# Patient Record
Sex: Male | Born: 1950 | Race: White | Hispanic: No | Marital: Married | Smoking: Former smoker
Health system: Southern US, Community
[De-identification: ages and names within clinical notes are randomized; demographics above are authoritative.]

## PROBLEM LIST (undated history)

## (undated) DIAGNOSIS — N529 Male erectile dysfunction, unspecified: Secondary | ICD-10-CM

## (undated) DIAGNOSIS — M109 Gout, unspecified: Secondary | ICD-10-CM

## (undated) DIAGNOSIS — N042 Nephrotic syndrome with diffuse membranous glomerulonephritis, unspecified: Secondary | ICD-10-CM

## (undated) DIAGNOSIS — N3281 Overactive bladder: Secondary | ICD-10-CM

## (undated) DIAGNOSIS — E785 Hyperlipidemia, unspecified: Secondary | ICD-10-CM

## (undated) DIAGNOSIS — I10 Essential (primary) hypertension: Secondary | ICD-10-CM

## (undated) DIAGNOSIS — R569 Unspecified convulsions: Secondary | ICD-10-CM

## (undated) DIAGNOSIS — K219 Gastro-esophageal reflux disease without esophagitis: Secondary | ICD-10-CM

## (undated) DIAGNOSIS — R001 Bradycardia, unspecified: Secondary | ICD-10-CM

## (undated) DIAGNOSIS — N289 Disorder of kidney and ureter, unspecified: Secondary | ICD-10-CM

## (undated) DIAGNOSIS — D849 Immunodeficiency, unspecified: Secondary | ICD-10-CM

## (undated) DIAGNOSIS — I639 Cerebral infarction, unspecified: Secondary | ICD-10-CM

## (undated) DIAGNOSIS — I2089 Other forms of angina pectoris: Secondary | ICD-10-CM

## (undated) DIAGNOSIS — N184 Chronic kidney disease, stage 4 (severe): Secondary | ICD-10-CM

## (undated) DIAGNOSIS — I208 Other forms of angina pectoris: Secondary | ICD-10-CM

## (undated) DIAGNOSIS — R079 Chest pain, unspecified: Secondary | ICD-10-CM

## (undated) HISTORY — DX: Hyperlipidemia, unspecified: E78.5

## (undated) HISTORY — PX: RECONSTRUCTION OF NOSE: SHX2301

## (undated) HISTORY — PX: HEMORRHOID SURGERY: SHX153

## (undated) HISTORY — PX: BACK SURGERY: SHX140

---

## 1998-07-01 HISTORY — PX: BACK SURGERY: SHX140

## 2007-04-24 ENCOUNTER — Ambulatory Visit: Payer: Self-pay | Admitting: Family Medicine

## 2008-03-05 ENCOUNTER — Ambulatory Visit: Payer: Self-pay | Admitting: Family Medicine

## 2009-05-26 ENCOUNTER — Ambulatory Visit: Payer: Self-pay | Admitting: Nephrology

## 2009-06-07 DIAGNOSIS — N042 Nephrotic syndrome with diffuse membranous glomerulonephritis: Secondary | ICD-10-CM | POA: Insufficient documentation

## 2010-02-14 ENCOUNTER — Ambulatory Visit: Payer: Self-pay | Admitting: Internal Medicine

## 2010-03-14 ENCOUNTER — Ambulatory Visit: Payer: Self-pay | Admitting: Unknown Physician Specialty

## 2010-03-29 ENCOUNTER — Ambulatory Visit: Payer: Self-pay | Admitting: Unknown Physician Specialty

## 2010-04-02 ENCOUNTER — Ambulatory Visit: Payer: Self-pay | Admitting: Unknown Physician Specialty

## 2010-04-28 ENCOUNTER — Ambulatory Visit: Payer: Self-pay | Admitting: Internal Medicine

## 2010-07-19 DIAGNOSIS — N183 Chronic kidney disease, stage 3 unspecified: Secondary | ICD-10-CM | POA: Insufficient documentation

## 2010-12-12 DIAGNOSIS — J329 Chronic sinusitis, unspecified: Secondary | ICD-10-CM | POA: Insufficient documentation

## 2011-06-04 ENCOUNTER — Ambulatory Visit: Payer: Self-pay

## 2012-07-23 DIAGNOSIS — K219 Gastro-esophageal reflux disease without esophagitis: Secondary | ICD-10-CM | POA: Insufficient documentation

## 2013-01-08 ENCOUNTER — Ambulatory Visit: Payer: Self-pay | Admitting: Family Medicine

## 2013-01-09 ENCOUNTER — Ambulatory Visit: Payer: Self-pay | Admitting: Emergency Medicine

## 2014-07-01 HISTORY — PX: INCISION AND DRAINAGE PERIRECTAL ABSCESS: SHX1804

## 2015-02-08 ENCOUNTER — Encounter: Payer: Self-pay | Admitting: Surgery

## 2015-02-08 ENCOUNTER — Ambulatory Visit
Admission: EM | Admit: 2015-02-08 | Discharge: 2015-02-08 | Disposition: A | Discharge status: Home / Self Care | Payer: No Typology Code available for payment source | Attending: Family Medicine | Admitting: Family Medicine

## 2015-02-08 ENCOUNTER — Ambulatory Visit (INDEPENDENT_AMBULATORY_CARE_PROVIDER_SITE_OTHER): Payer: PRIVATE HEALTH INSURANCE | Admitting: Surgery

## 2015-02-08 VITALS — BP 122/68 | HR 86 | Temp 98.5°F | Ht 70.0 in | Wt 226.0 lb

## 2015-02-08 DIAGNOSIS — L0231 Cutaneous abscess of buttock: Secondary | ICD-10-CM | POA: Insufficient documentation

## 2015-02-08 DIAGNOSIS — L0291 Cutaneous abscess, unspecified: Secondary | ICD-10-CM

## 2015-02-08 DIAGNOSIS — L0390 Cellulitis, unspecified: Secondary | ICD-10-CM

## 2015-02-08 HISTORY — DX: Immunodeficiency, unspecified: D84.9

## 2015-02-08 HISTORY — DX: Disorder of kidney and ureter, unspecified: N28.9

## 2015-02-08 HISTORY — DX: Gout, unspecified: M10.9

## 2015-02-08 LAB — CBC WITH DIFFERENTIAL/PLATELET
Basophils Absolute: 0.1 10*3/uL (ref 0–0.1)
Basophils Relative: 0 %
EOS PCT: 0 %
Eosinophils Absolute: 0 10*3/uL (ref 0–0.7)
HEMATOCRIT: 40.3 % (ref 40.0–52.0)
Hemoglobin: 13.6 g/dL (ref 13.0–18.0)
LYMPHS ABS: 1.6 10*3/uL (ref 1.0–3.6)
LYMPHS PCT: 8 %
MCH: 30.2 pg (ref 26.0–34.0)
MCHC: 33.7 g/dL (ref 32.0–36.0)
MCV: 89.6 fL (ref 80.0–100.0)
MONOS PCT: 10 %
Monocytes Absolute: 2.1 10*3/uL — ABNORMAL HIGH (ref 0.2–1.0)
Neutro Abs: 16.5 10*3/uL — ABNORMAL HIGH (ref 1.4–6.5)
Neutrophils Relative %: 82 %
Platelets: 239 10*3/uL (ref 150–440)
RBC: 4.5 MIL/uL (ref 4.40–5.90)
RDW: 14.2 % (ref 11.5–14.5)
WBC: 20.3 10*3/uL — AB (ref 3.8–10.6)

## 2015-02-08 MED ORDER — HYDROCODONE-ACETAMINOPHEN 10-325 MG PO TABS: 1.0000 | ORAL_TABLET | Freq: Four times a day (QID) | ORAL | Status: DC | PRN

## 2015-02-08 MED ORDER — CEPHALEXIN 500 MG PO CAPS
500.0000 mg | ORAL_CAPSULE | Freq: Four times a day (QID) | ORAL | Status: DC
Start: 2015-02-08 — End: 2016-11-06

## 2015-02-08 NOTE — ED Provider Notes (Signed)
Patient presents with red warm tender area on the right gluteal area for approximately 2 weeks. Patient states that it is now very painful to walk and sit. He has not had any significant drainage from the site. He also states that he got hit by a tick on his right hip about 2 weeks ago and has felt extremely tired. He denies any other rash. He denies any joint pain, muscle pain, headache. He denies any history of MRSA in the past.  ROS: Negative except mentioned above. Vitals as per Epic.   GENERAL: NAD HEENT: no pharyngeal erythema, no exudate RESP: CTA B CARD: RRR SKIN: approx. 2in x 2in erythematous hard tender area on right glute close to the rectum but not involving it, no other generalized rash noted  NEURO: CN II-XII groslly intact   A/P: Abscess-recommend evaluation by general surgery today, will also draw labs for Lyme and RMSF. I have asked that the patient mention the tick bite as well to the surgeon so that perhaps doxycycline would be an appropriate antibiotic treatment for both issues.  Paulina Fusi, MD 02/08/15 1322

## 2015-02-08 NOTE — ED Notes (Addendum)
States noted "abscess" near rectum x 2 weeks. Painful to walk and sit. Also tick bite to right hip 2 weeks ago and has been "unbelievably tired". Also states is on an unknown medication for kidney disease.

## 2015-02-08 NOTE — Patient Instructions (Signed)
Place dry dressing to wound as needed. Follow-up in the office tomorrow for packing change. Oral anabiotic's and pain medication as needed as described.

## 2015-02-08 NOTE — Progress Notes (Signed)
Surgical Consultation  02/08/2015  Peter Richard is an 64 y.o. male.   CC: Right buttock pain  HPI: This patient with 3 weeks of right buttock pain and it is never drained but he is getting to where he cannot sit down and now he works as a Development worker, community denies fevers or chills. Never had an episode like this before.  Past Medical History  Diagnosis Date  . Immune deficiency disorder   . Kidney disease   . Gout   . Hyperlipidemia     Past Surgical History  Procedure Laterality Date  . Back surgery      Family History  Problem Relation Age of Onset  . Rashes / Skin problems Mother   . Rashes / Skin problems Father     Social History:  reports that he has been smoking.  He has never used smokeless tobacco. He reports that he drinks alcohol. He reports that he does not use illicit drugs.  Allergies: No Known Allergies  Medications reviewed.   Review of Systems:   Review of Systems  Constitutional: Negative for fever and chills.  HENT: Negative.   Eyes: Negative.   Respiratory: Negative.   Cardiovascular: Negative.   Gastrointestinal: Negative.   Genitourinary: Negative.   Musculoskeletal: Negative.   Skin: Negative.   Neurological: Negative.  Negative for headaches.  Endo/Heme/Allergies: Negative.   Psychiatric/Behavioral: Negative.      Physical Exam:  BP 122/68 mmHg  Pulse 86  Temp(Src) 98.5 F (36.9 C) (Oral)  Ht 5\' 10"  (1.778 m)  Wt 226 lb (102.513 kg)  BMI 32.43 kg/m2  Physical Exam  Constitutional: He is oriented to person, place, and time and well-developed, well-nourished, and in no distress. No distress.  HENT:  Head: Normocephalic and atraumatic.  Eyes: No scleral icterus.  Pulmonary/Chest: Effort normal. No respiratory distress.  Abdominal: Soft.  Musculoskeletal: Normal range of motion.  Neurological: He is alert and oriented to person, place, and time.  Skin: There is erythema.  Right buttock abscess it is not in the  perirectal area nor is it in the pilonidal area it is somewhat in between. Etiology quite unclear.  Psychiatric: Mood, affect and judgment normal.      Results for orders placed or performed during the hospital encounter of 02/08/15 (from the past 48 hour(s))  CBC with Differential     Status: Abnormal   Collection Time: 02/08/15  1:17 PM  Result Value Ref Range   WBC 20.3 (H) 3.8 - 10.6 K/uL   RBC 4.50 4.40 - 5.90 MIL/uL   Hemoglobin 13.6 13.0 - 18.0 g/dL   HCT 40.3 40.0 - 52.0 %   MCV 89.6 80.0 - 100.0 fL   MCH 30.2 26.0 - 34.0 pg   MCHC 33.7 32.0 - 36.0 g/dL   RDW 14.2 11.5 - 14.5 %   Platelets 239 150 - 440 K/uL   Neutrophils Relative % 82 %   Neutro Abs 16.5 (H) 1.4 - 6.5 K/uL   Lymphocytes Relative 8 %   Lymphs Abs 1.6 1.0 - 3.6 K/uL   Monocytes Relative 10 %   Monocytes Absolute 2.1 (H) 0.2 - 1.0 K/uL   Eosinophils Relative 0 %   Eosinophils Absolute 0.0 0 - 0.7 K/uL   Basophils Relative 0 %   Basophils Absolute 0.1 0 - 0.1 K/uL   No results found.  Assessment/Plan:  Buttock abscess on the right side not clearly a pilonidal nor clearly a perirectal. This may be a skin infection  only and I will recommend incision and drainage. I discussed with him the rationale for this the options of observation the procedure itself and possible packing placement. He understood and agreed to proceed with incision and drainage with this type time Drainage was performed as follows. Local anesthesia was infiltrated into the skin and subcutaneous tissues of an area of considerable fluctuance and erythema. An incision was made and drainage was performed there was a cavity present but minimal purulence exuded cultures were obtained. Dressing was placed as was packing. He will return tomorrow morning for packing change. I will start him on Keflex empirically   Florene Glen, MD, FACS

## 2015-02-08 NOTE — Discharge Instructions (Signed)
Patient needs to see General Surgery today.

## 2015-02-09 ENCOUNTER — Ambulatory Visit (INDEPENDENT_AMBULATORY_CARE_PROVIDER_SITE_OTHER): Payer: PRIVATE HEALTH INSURANCE | Admitting: Surgery

## 2015-02-09 VITALS — BP 128/69 | HR 86 | Temp 98.6°F | Wt 224.0 lb

## 2015-02-09 DIAGNOSIS — L0390 Cellulitis, unspecified: Secondary | ICD-10-CM

## 2015-02-09 DIAGNOSIS — L0291 Cutaneous abscess, unspecified: Secondary | ICD-10-CM

## 2015-02-09 LAB — ROCKY MTN SPOTTED FVR ABS PNL(IGG+IGM)
RMSF IgG: NEGATIVE
RMSF IgM: 0.17 {index} (ref 0.00–0.89)

## 2015-02-09 LAB — B. BURGDORFI ANTIBODIES: B burgdorferi Ab IgG+IgM: <0.91 {ISR} (ref 0.00–0.90)

## 2015-02-09 NOTE — Patient Instructions (Signed)
Follow-up in our office in [place] next week sooner should you have worsening of pain or drainage Remove packing tomorrow morning in the shower and utilize dry dressing as needed

## 2015-02-09 NOTE — Progress Notes (Signed)
Outpatient postop visit  02/09/2015  Peter Richard is an 64 y.o. male.    Procedure: I&D of right buttock abscess  CC: He is much better  HPI: Patient states he has no fevers or chills is feeling better compared to yesterday not bring his packing jar with him however  Medications reviewed.    Physical Exam:  BP 128/69 mmHg  Pulse 86  Temp(Src) 98.6 F (37 C) (Oral)  Wt 224 lb (101.606 kg)    PE: Much improved wound minimal expressible pus after removing packing. Less erythema less tender less induration    Assessment/Plan:  Status post I&D of a right buttock abscess not clear if this is a perirectal or pilonidal it is halfway in between and location. This was repacked, cultures are currently pending he is on antibiotic's., I recommend that he follow up in our office next week while he can remove his packing in the shower and place a dry dressing.  Florene Glen, MD, FACS

## 2015-02-10 ENCOUNTER — Telehealth: Payer: Self-pay | Admitting: Surgery

## 2015-02-10 NOTE — Telephone Encounter (Signed)
Patient was told he could take a shower and remove the bandage, He said it was very swollen and wanted to know is it normal

## 2015-02-10 NOTE — Telephone Encounter (Signed)
Returned call to patient at this time.  States that he does not think that this is any worse than yesterday but he has not been able to see it to this point and he was scared when he saw this. Denies fever, more pain, or worsening redness or drainage at this time.  I explained that if he does not feel this is worse than yesterday and Dr. Burt Knack saw this yesterday, it is very unlikely that he needs to be seen for this today. He was given the main switchboard number to [facility] and asked to call over the weekend and ask for surgeon on call if this becomes significantly more painful, or if he develops a fever/chills, or nausea/vomiting.  Verbalizes understanding of this conversation and will call with any problems.

## 2015-02-16 ENCOUNTER — Ambulatory Visit: Payer: PRIVATE HEALTH INSURANCE | Admitting: Surgery

## 2015-07-02 HISTORY — PX: INCISION AND DRAINAGE PERIRECTAL ABSCESS: SHX1804

## 2016-05-09 DIAGNOSIS — I639 Cerebral infarction, unspecified: Secondary | ICD-10-CM | POA: Insufficient documentation

## 2016-11-06 ENCOUNTER — Ambulatory Visit
Admission: EM | Admit: 2016-11-06 | Discharge: 2016-11-06 | Disposition: A | Discharge status: Home / Self Care | Payer: Worker's Compensation | Attending: Family Medicine | Admitting: Family Medicine

## 2016-11-06 ENCOUNTER — Encounter: Payer: Self-pay | Admitting: *Deleted

## 2016-11-06 DIAGNOSIS — H00033 Abscess of eyelid right eye, unspecified eyelid: Secondary | ICD-10-CM

## 2016-11-06 DIAGNOSIS — S0501XD Injury of conjunctiva and corneal abrasion without foreign body, right eye, subsequent encounter: Secondary | ICD-10-CM | POA: Diagnosis not present

## 2016-11-06 MED ORDER — MOXIFLOXACIN HCL 0.5 % OP SOLN: 1.0000 [drp] | Freq: Three times a day (TID) | OPHTHALMIC | 0 refills | Status: DC

## 2016-11-06 MED ORDER — CEPHALEXIN 500 MG PO CAPS: 500.0000 mg | ORAL_CAPSULE | Freq: Four times a day (QID) | ORAL | 0 refills | Status: DC

## 2016-11-06 NOTE — ED Triage Notes (Signed)
Last Friday at work, pt was tearing out a sheet rock wall and felt a foreign body hit his right eye. He feels the object is still in his eye. Right eye is red, edematous, and painful, and is matted closed mornings.

## 2016-12-11 NOTE — ED Provider Notes (Signed)
[facility]    CSN: [id number] Arrival date & time: 11/06/16  1549     History   Chief Complaint Chief Complaint  Patient presents with  . Eye Problem    HPI Peter Richard is a 66 y.o. male.   66 yo male with a c/o right eye discomfort. States last Friday at work, pt was tearing out a sheet rock wall and felt a foreign body hit his right eye. States he feels like object is still in his eye but does not see a foreign body in there.     The history is provided by the patient.  Eye Problem  Location:  Right eye Quality:  Tearing Onset quality:  Sudden Timing:  Constant Chronicity:  New   Past Medical History:  Diagnosis Date  . Gout   . Hyperlipidemia   . Immune deficiency disorder ([place])   . Kidney disease     There are no active problems to display for this patient.   Past Surgical History:  Procedure Laterality Date  . BACK SURGERY         Home Medications    Prior to Admission medications   Medication Sig Start Date End Date Taking? Authorizing Provider  allopurinol (ZYLOPRIM) 100 MG tablet Take 100 mg by mouth daily.   Yes [provider]  aspirin (ASPIR-81) 81 MG EC tablet Take 81 mg by mouth. 07/24/12  Yes [provider]  atorvastatin (LIPITOR) 40 MG tablet Take 40 mg by mouth. 02/22/14  Yes [provider]  cholecalciferol (VITAMIN D) 400 UNITS TABS tablet Take 400 Units by mouth.   Yes [provider]  cephALEXin (KEFLEX) 500 MG capsule Take 1 capsule (500 mg total) by mouth 4 (four) times daily. 11/06/16   Norval Gable, MD  colchicine 0.6 MG tablet Take 0.6 mg by mouth daily.    [provider]  DOCOSAHEXAENOIC ACID PO Take by mouth. 11/23/10   [provider]  febuxostat (ULORIC) 40 MG tablet Take 40 mg by mouth. 03/29/14   [provider]  fluocinonide ointment (LIDEX) 0.05 % Apply to rash on leg twice a day. If not resolved after one week, wrap saran wrap around leg over top of  medicine at nighttime 06/22/14   [provider]  geriatric multivitamins-minerals (ELDERTONIC/GEVRABON) ELIX Take 15 mLs by mouth daily.    [provider]  HYDROcodone-acetaminophen (NORCO) 10-325 MG per tablet Take 1 tablet by mouth every 6 (six) hours as needed. 02/08/15   Florene Glen, MD  moxifloxacin (VIGAMOX) 0.5 % ophthalmic solution Place 1 drop into the right eye 3 (three) times daily. 11/06/16   Norval Gable, MD  predniSONE (DELTASONE) 20 MG tablet 40 mg po qd x 4 days prn gout flare 03/25/14   [provider]  ranitidine (ZANTAC) 150 MG capsule Take 150 mg by mouth. 12/26/14 12/26/15  [provider]  sildenafil (VIAGRA) 100 MG tablet Take by mouth. 06/15/13   [provider]    Family History Family History  Problem Relation Age of Onset  . Rashes / Skin problems Mother   . Rashes / Skin problems Father     Social History Social History  Substance Use Topics  . Smoking status: Current Every Day Smoker  . Smokeless tobacco: Never Used  . Alcohol use Yes     Comment: rarely     Allergies   Patient has no known allergies.   Review of Systems Review of Systems   Physical  Exam Triage Vital Signs ED Triage Vitals  Enc Vitals Group     BP 11/06/16 1614 129/83     Pulse Rate 11/06/16 1614 73     Resp 11/06/16 1614 16     Temp 11/06/16 1614 98.7 F (37.1 C)     Temp Source 11/06/16 1614 Oral     SpO2 11/06/16 1614 98 %     Weight 11/06/16 1617 215 lb (97.5 kg)     Height 11/06/16 1617 5\' 10"  (1.778 m)     Head Circumference --      Peak Flow --      Pain Score --      Pain Loc --      Pain Edu? --      Excl. in [place]? --    No data found.   Updated Vital Signs BP 129/83 (BP Location: Left Arm)   Pulse 73   Temp 98.7 F (37.1 C) (Oral)   Resp 16   Ht 5\' 10"  (1.778 m)   Wt 215 lb (97.5 kg)   SpO2 98%   BMI 30.85 kg/m   Visual Acuity Right Eye Distance: 20/30 Left Eye Distance: 20/40 Bilateral  Distance: 20/25  Right Eye Near:   Left Eye Near:    Bilateral Near:     Physical Exam  Constitutional: He appears well-developed and well-nourished. No distress.  Eyes: Conjunctivae and EOM are normal. Pupils are equal, round, and reactive to light.  Slit lamp exam:      The right eye shows corneal abrasion.  Right upper and lower eyelid with mild edema, tenderness and diffuse erythema  Skin: He is not diaphoretic.  Nursing note and vitals reviewed.    UC Treatments / Results  Labs (all labs ordered are listed, but only abnormal results are displayed) Labs Reviewed - No data to display  EKG  EKG Interpretation None       Radiology No results found.  Procedures Procedures (including critical care time)  Medications Ordered in UC Medications - No data to display   Initial Impression / Assessment and Plan / UC Course  I have reviewed the triage vital signs and the nursing notes.  Pertinent labs & imaging results that were available during my care of the patient were reviewed by me and considered in my medical decision making (see chart for details).       Final Clinical Impressions(s) / UC Diagnoses   Final diagnoses:  Abrasion of right cornea, subsequent encounter  Cellulitis of right eyelid    New Prescriptions Discharge Medication List as of 11/06/2016  4:57 PM    START taking these medications   Details  moxifloxacin (VIGAMOX) 0.5 % ophthalmic solution Place 1 drop into the right eye 3 (three) times daily., Starting Wed 11/06/2016, Normal       1. diagnosis reviewed with patient 2. rx as per orders above; reviewed possible side effects, interactions, risks and benefits; rx for oral keflex as well as per orders 3. Recommend supportive treatment with warm compresses 4. Follow-up prn if symptoms worsen or don't improve   Norval Gable, MD 12/11/16 1031

## 2017-12-16 ENCOUNTER — Ambulatory Visit
Admission: RE | Admit: 2017-12-16 | Discharge: 2017-12-16 | Disposition: A | Discharge status: Home / Self Care | Payer: Worker's Compensation | Source: Ambulatory Visit | Attending: Physician Assistant | Admitting: Physician Assistant

## 2017-12-16 ENCOUNTER — Other Ambulatory Visit: Payer: Self-pay | Admitting: Physician Assistant

## 2017-12-16 DIAGNOSIS — W19XXXA Unspecified fall, initial encounter: Secondary | ICD-10-CM | POA: Diagnosis not present

## 2017-12-16 DIAGNOSIS — M542 Cervicalgia: Secondary | ICD-10-CM

## 2017-12-16 DIAGNOSIS — S20229A Contusion of unspecified back wall of thorax, initial encounter: Secondary | ICD-10-CM | POA: Insufficient documentation

## 2017-12-16 DIAGNOSIS — R0781 Pleurodynia: Secondary | ICD-10-CM

## 2017-12-16 DIAGNOSIS — M50322 Other cervical disc degeneration at C5-C6 level: Secondary | ICD-10-CM | POA: Diagnosis not present

## 2017-12-23 ENCOUNTER — Other Ambulatory Visit: Payer: Self-pay | Admitting: Physician Assistant

## 2018-05-12 DIAGNOSIS — E785 Hyperlipidemia, unspecified: Secondary | ICD-10-CM | POA: Insufficient documentation

## 2018-05-12 DIAGNOSIS — M1A9XX0 Chronic gout, unspecified, without tophus (tophi): Secondary | ICD-10-CM | POA: Insufficient documentation

## 2018-10-08 ENCOUNTER — Other Ambulatory Visit: Payer: Self-pay | Admitting: Neurology

## 2018-10-08 DIAGNOSIS — I639 Cerebral infarction, unspecified: Secondary | ICD-10-CM

## 2018-10-24 DIAGNOSIS — R4701 Aphasia: Secondary | ICD-10-CM | POA: Insufficient documentation

## 2018-12-02 ENCOUNTER — Ambulatory Visit
Admission: RE | Admit: 2018-12-02 | Discharge: 2018-12-02 | Disposition: A | Discharge status: Home / Self Care | Payer: BC Managed Care – PPO | Source: Ambulatory Visit | Attending: Neurology | Admitting: Neurology

## 2018-12-02 ENCOUNTER — Other Ambulatory Visit: Payer: Self-pay

## 2018-12-02 DIAGNOSIS — I639 Cerebral infarction, unspecified: Secondary | ICD-10-CM | POA: Insufficient documentation

## 2019-02-23 DIAGNOSIS — R569 Unspecified convulsions: Secondary | ICD-10-CM | POA: Insufficient documentation

## 2019-06-28 ENCOUNTER — Other Ambulatory Visit: Payer: Self-pay | Admitting: Neurology

## 2019-06-28 DIAGNOSIS — I679 Cerebrovascular disease, unspecified: Secondary | ICD-10-CM

## 2019-06-28 DIAGNOSIS — G43909 Migraine, unspecified, not intractable, without status migrainosus: Secondary | ICD-10-CM

## 2019-07-07 ENCOUNTER — Ambulatory Visit
Admission: RE | Admit: 2019-07-07 | Discharge: 2019-07-07 | Disposition: A | Discharge status: Home / Self Care | Payer: BC Managed Care – PPO | Source: Ambulatory Visit | Attending: Neurology | Admitting: Neurology

## 2019-07-07 ENCOUNTER — Other Ambulatory Visit: Payer: Self-pay

## 2019-07-07 DIAGNOSIS — G43909 Migraine, unspecified, not intractable, without status migrainosus: Secondary | ICD-10-CM

## 2019-07-07 DIAGNOSIS — I679 Cerebrovascular disease, unspecified: Secondary | ICD-10-CM | POA: Diagnosis present

## 2019-07-07 LAB — POCT I-STAT CREATININE: Creatinine, Ser: 1.6 mg/dL — ABNORMAL HIGH (ref 0.61–1.24)

## 2019-07-07 MED ORDER — GADOBUTROL 1 MMOL/ML IV SOLN
9.0000 mL | Freq: Once | INTRAVENOUS | Status: AC | PRN
  Administered 2019-07-07: 18:00:00 9 mL via INTRAVENOUS

## 2019-08-26 ENCOUNTER — Ambulatory Visit: Payer: BC Managed Care – PPO | Attending: Internal Medicine

## 2019-08-26 DIAGNOSIS — Z23 Encounter for immunization: Secondary | ICD-10-CM | POA: Insufficient documentation

## 2019-08-26 NOTE — Progress Notes (Signed)
   Covid-19 Vaccination Clinic  Name:  Peter Richard    MRN: [id number] DOB: [birth year]  08/26/2019  [name] was observed post Covid-19 immunization for 15 minutes without incidence. He was provided with Vaccine Information Sheet and instruction to access the V-Safe system.   [name] was instructed to call 911 with any severe reactions post vaccine: [place] Difficulty breathing  . Swelling of your face and throat  . A fast heartbeat  . A bad rash all over your body  . Dizziness and weakness    Immunizations Administered    Name Date Dose VIS Date Route   Pfizer COVID-19 Vaccine 08/26/2019  8:40 AM 0.3 mL 06/11/2019 Intramuscular   Manufacturer: [place]   Lot: [id number]   [place]: [id number]

## 2019-09-21 ENCOUNTER — Ambulatory Visit: Payer: BC Managed Care – PPO | Attending: Internal Medicine

## 2019-09-21 DIAGNOSIS — Z23 Encounter for immunization: Secondary | ICD-10-CM

## 2019-09-21 NOTE — Progress Notes (Signed)
   Covid-19 Vaccination Clinic  Name:  Peter Richard    MRN: [id number] DOB: 27-Sep-1950  09/21/2019  [name] was observed post Covid-19 immunization for 15 minutes without incident. He was provided with Vaccine Information Sheet and instruction to access the V-Safe system.   [name] was instructed to call 911 with any severe reactions post vaccine: [place] Kitchen Difficulty breathing  . Swelling of face and throat  . A fast heartbeat  . A bad rash all over body  . Dizziness and weakness   Immunizations Administered    Name Date Dose VIS Date Route   Pfizer COVID-19 Vaccine 09/21/2019  8:42 AM 0.3 mL 06/11/2019 Intramuscular   Manufacturer: [place]   Lot: [id number]   [place]: [id number]

## 2019-12-17 ENCOUNTER — Other Ambulatory Visit: Payer: Self-pay

## 2019-12-17 ENCOUNTER — Encounter: Payer: Self-pay | Admitting: Urology

## 2019-12-17 ENCOUNTER — Ambulatory Visit: Payer: BC Managed Care – PPO | Admitting: Urology

## 2019-12-17 VITALS — BP 123/72 | HR 60 | Ht 70.0 in | Wt 206.0 lb

## 2019-12-17 DIAGNOSIS — N401 Enlarged prostate with lower urinary tract symptoms: Secondary | ICD-10-CM | POA: Diagnosis not present

## 2019-12-17 DIAGNOSIS — R972 Elevated prostate specific antigen [PSA]: Secondary | ICD-10-CM | POA: Diagnosis not present

## 2019-12-17 MED ORDER — TAMSULOSIN HCL 0.4 MG PO CAPS: 0.4000 mg | ORAL_CAPSULE | Freq: Every day | ORAL | 0 refills | Status: DC

## 2019-12-17 NOTE — Progress Notes (Signed)
12/17/2019 1:01 PM   Peter Richard 1950/08/26 [id number]  Referring provider: Sofie Hartigan, MD [place],  [place] [address]  Chief Complaint  Patient presents with  . Elevated PSA    HPI: Peter Richard is a 69 yo male seen at the request of Dr. Ellison Hughs for evaluation of an elevated PSA.  -PSA 09/2019 was slightly elevated at 4.52; repeat 10/2019 5.92 -Only prior PSA results and chart is in 2015 and was 2.6 -Several month history of worsening LUTS including frequency, urgency with occasional episodes of urge incontinence and weak urinary stream -Denies dysuria, gross hematuria -Denies flank, abdominal, pelvic pain -Se[facility]rology at [facility] for pyuria and overactive bladder -Followed by nephrology at [facility] for combined membranous nephropathy/tip lesion FSGS in remission   PMH: Past Medical History:  Diagnosis Date  . Gout   . Hyperlipidemia   . Immune deficiency disorder ([place])   . Kidney disease     Surgical History: Past Surgical History:  Procedure Laterality Date  . BACK SURGERY      Home Medications:  Allergies as of 12/17/2019      Reactions   Lisinopril    Other reaction(s): Dizziness   Doxycycline Diarrhea, Nausea And Vomiting, Nausea Only   Other reaction(s): Dizziness      Medication List       Accurate as of December 17, 2019  1:01 PM. If you have any questions, ask your nurse or doctor.        allopurinol 100 MG tablet Commonly known as: ZYLOPRIM Take 100 mg by mouth daily.   Aspirin 81 81 MG EC tablet Generic drug: aspirin Take 81 mg by mouth.   cephALEXin 500 MG capsule Commonly known as: KEFLEX Take 1 capsule (500 mg total) by mouth 4 (four) times daily.   cholecalciferol 10 MCG (400 UNIT) Tabs tablet Commonly known as: VITAMIN D3 Take 400 Units by mouth.   colchicine 0.6 MG tablet Take 0.6 mg by mouth daily.   DOCOSAHEXAENOIC ACID PO Take by mouth.   fluocinonide ointment 0.05 % Commonly known as: LIDEX Apply to  rash on leg twice a day. If not resolved after one week, wrap saran wrap around leg over top of medicine at nighttime   geriatric multivitamins-minerals Elix Take 15 mLs by mouth daily.   HYDROcodone-acetaminophen 10-325 MG tablet Commonly known as: NORCO Take 1 tablet by mouth every 6 (six) hours as needed.   Lipitor 40 MG tablet Generic drug: atorvastatin Take 40 mg by mouth.   moxifloxacin 0.5 % ophthalmic solution Commonly known as: Vigamox Place 1 drop into the right eye 3 (three) times daily.   predniSONE 20 MG tablet Commonly known as: DELTASONE 40 mg po qd x 4 days prn gout flare   ranitidine 150 MG capsule Commonly known as: ZANTAC Take 150 mg by mouth.   Uloric 40 MG tablet Generic drug: febuxostat Take 40 mg by mouth.   Viagra 100 MG tablet Generic drug: sildenafil Take by mouth.       Allergies:  Allergies  Allergen Reactions  . Lisinopril     Other reaction(s): Dizziness  . Doxycycline Diarrhea, Nausea And Vomiting and Nausea Only    Other reaction(s): Dizziness    Family History: Family History  Problem Relation Age of Onset  . Rashes / Skin problems Mother   . Rashes / Skin problems Father     Social History:  reports that he has been smoking. He has never used smokeless  tobacco. He reports current alcohol use. He reports that he does not use drugs.   Physical Exam: BP 123/72   Pulse 60   Ht 5\' 10"  (1.778 m)   Wt 206 lb (93.4 kg)   BMI 29.56 kg/m   Constitutional:  Alert and oriented, No acute distress. HEENT: [place] AT, moist mucus membranes.  Trachea midline, no masses. Cardiovascular: No clubbing, cyanosis, or edema. Respiratory: Normal respiratory effort, no increased work of breathing. GI: Abdomen is soft, nontender, nondistended, no abdominal masses GU: Prostate 40 g, smooth without nodules Skin: No rashes, bruises or suspicious lesions. Neurologic: Grossly intact, no focal deficits, moving all 4 extremities. Psychiatric: Normal  mood and affect.   Assessment & Plan:    1.  Elevated PSA -Although PSA is a prostate cancer screening test he was informed that cancer is not the most common cause of an elevated PSA. Other potential causes including BPH and inflammation were discussed. He was informed that the only way to adequately diagnose prostate cancer would be a transrectal ultrasound and biopsy of the prostate. The procedure was discussed including potential risks of bleeding and infection/sepsis. He was also informed that a negative biopsy does not conclusively rule out the possibility that prostate cancer may be present and that continued monitoring is required. The use of newer adjunctive blood tests including PHI and 4kScore were discussed. The use of multiparametric prostate MRI was also discussed. Continued periodic surveillance was also discussed.  -We also reviewed newer prostate cancer screening guidelines between ages of 43-69  -Based on his worsening voiding symptoms a urinalysis was ordered today and he will be notified with results.  Start tamsulosin 0.4 mg daily and return in 1 month for a repeat PSA.  Further recommendations pending after review of his repeat PSA results.  2.  Lower urinary tract symptoms Most likely secondary to BPH.  Trial tamsulosin as above   Abbie Sons, MD  [facility] [address] [place], [place] [address] [phone]

## 2019-12-20 ENCOUNTER — Encounter: Payer: Self-pay | Admitting: Urology

## 2019-12-20 LAB — URINALYSIS, COMPLETE
Bilirubin, UA: NEGATIVE
Glucose, UA: NEGATIVE
Ketones, UA: NEGATIVE
Leukocytes,UA: NEGATIVE
Nitrite, UA: NEGATIVE
RBC, UA: NEGATIVE
Specific Gravity, UA: 1.025 (ref 1.005–1.030)
Urobilinogen, Ur: 0.2 mg/dL (ref 0.2–1.0)
pH, UA: 5 (ref 5.0–7.5)

## 2019-12-20 LAB — MICROSCOPIC EXAMINATION: Bacteria, UA: NONE SEEN

## 2020-01-08 ENCOUNTER — Other Ambulatory Visit: Payer: Self-pay | Admitting: Urology

## 2020-01-17 ENCOUNTER — Other Ambulatory Visit: Payer: BC Managed Care – PPO

## 2020-01-17 ENCOUNTER — Other Ambulatory Visit: Payer: Self-pay

## 2020-01-17 DIAGNOSIS — R972 Elevated prostate specific antigen [PSA]: Secondary | ICD-10-CM

## 2020-01-17 DIAGNOSIS — N401 Enlarged prostate with lower urinary tract symptoms: Secondary | ICD-10-CM

## 2020-01-18 ENCOUNTER — Telehealth: Payer: Self-pay | Admitting: *Deleted

## 2020-01-18 LAB — PSA: Prostate Specific Ag, Serum: 4.1 ng/mL — ABNORMAL HIGH (ref 0.0–4.0)

## 2020-01-18 NOTE — Telephone Encounter (Signed)
-----   Message from Abbie Sons, MD sent at 01/18/2020  7:33 AM EDT ----- PSA looks much better at 4.1.  Recommend follow-up office visit with repeat PSA in 6 months

## 2020-01-18 NOTE — Telephone Encounter (Signed)
Notified patient as instructed, patient pleased. Discussed follow-up appointments, patient agrees  

## 2020-02-02 ENCOUNTER — Other Ambulatory Visit: Payer: Self-pay | Admitting: Urology

## 2020-05-01 ENCOUNTER — Emergency Department
Admission: EM | Admit: 2020-05-01 | Discharge: 2020-05-01 | Disposition: A | Discharge status: Home / Self Care | Payer: Medicare Other | Attending: Emergency Medicine | Admitting: Emergency Medicine

## 2020-05-01 DIAGNOSIS — Z5321 Procedure and treatment not carried out due to patient leaving prior to being seen by health care provider: Secondary | ICD-10-CM | POA: Diagnosis present

## 2020-05-01 NOTE — ED Notes (Signed)
Pt and wife state they are leaving and will go to [facility].

## 2020-05-02 ENCOUNTER — Other Ambulatory Visit: Payer: Self-pay | Admitting: Urology

## 2020-05-07 DIAGNOSIS — G43009 Migraine without aura, not intractable, without status migrainosus: Secondary | ICD-10-CM | POA: Insufficient documentation

## 2020-05-10 ENCOUNTER — Other Ambulatory Visit: Payer: Self-pay

## 2020-05-10 ENCOUNTER — Ambulatory Visit: Payer: Medicare Other | Admitting: Urology

## 2020-05-10 ENCOUNTER — Encounter: Payer: Self-pay | Admitting: Urology

## 2020-05-10 VITALS — BP 150/78 | HR 53 | Ht 70.0 in | Wt 200.0 lb

## 2020-05-10 DIAGNOSIS — R972 Elevated prostate specific antigen [PSA]: Secondary | ICD-10-CM | POA: Diagnosis not present

## 2020-05-10 DIAGNOSIS — N401 Enlarged prostate with lower urinary tract symptoms: Secondary | ICD-10-CM | POA: Diagnosis not present

## 2020-05-10 LAB — BLADDER SCAN AMB NON-IMAGING: Scan Result: 0

## 2020-05-11 ENCOUNTER — Encounter: Payer: Self-pay | Admitting: Urology

## 2020-05-11 NOTE — Progress Notes (Signed)
05/10/2020 9:10 AM   Diona Foley 01/14/1951 [id number]  Referring provider: Sofie Hartigan, MD [place],  [place] [address]  Chief Complaint  Patient presents with  . Urinary Incontinence    HPI: 69 y.o. male presents for follow-up of lower urinary tract symptoms.   Initially seen 12/17/2019 for a PSA 5.92 and bothersome LUTS  Follow-up PSA returned to baseline at 4.2  He was given a trial of tamsulosin and saw no significant improvement in his lower urinary tract symptoms which include frequency, urgency, weak urinary stream with occasional episodes of urge incontinence  No dysuria or gross hematuria  Denies flank, abdominal or pelvic pain   PMH: Past Medical History:  Diagnosis Date  . Gout   . Hyperlipidemia   . Immune deficiency disorder ([place])   . Kidney disease     Surgical History: Past Surgical History:  Procedure Laterality Date  . BACK SURGERY      Home Medications:  Allergies as of 05/10/2020      Reactions   Lisinopril    Other reaction(s): Dizziness   Doxycycline Diarrhea, Nausea And Vomiting, Nausea Only   Other reaction(s): Dizziness      Medication List       Accurate as of May 10, 2020 11:59 PM. If you have any questions, ask your nurse or doctor.        allopurinol 100 MG tablet Commonly known as: ZYLOPRIM Take 100 mg by mouth daily.   Aspirin 81 81 MG EC tablet Generic drug: aspirin Take 81 mg by mouth.   cholecalciferol 10 MCG (400 UNIT) Tabs tablet Commonly known as: VITAMIN D3 Take 400 Units by mouth.   Lipitor 40 MG tablet Generic drug: atorvastatin Take 40 mg by mouth.   ranitidine 150 MG capsule Commonly known as: ZANTAC Take 150 mg by mouth.   tamsulosin 0.4 MG Caps capsule Commonly known as: FLOMAX TAKE 1 CAPSULE BY MOUTH EVERY DAY   Uloric 40 MG tablet Generic drug: febuxostat Take 40 mg by mouth.   Viagra 100 MG tablet Generic drug: sildenafil Take by mouth.       Allergies:   Allergies  Allergen Reactions  . Lisinopril     Other reaction(s): Dizziness  . Doxycycline Diarrhea, Nausea And Vomiting and Nausea Only    Other reaction(s): Dizziness    Family History: Family History  Problem Relation Age of Onset  . Rashes / Skin problems Mother   . Rashes / Skin problems Father     Social History:  reports that he has been smoking. He has never used smokeless tobacco. He reports current alcohol use. He reports that he does not use drugs.   Physical Exam: BP (!) 150/78   Pulse (!) 53   Ht 5\' 10"  (1.778 m)   Wt 200 lb (90.7 kg)   BMI 28.70 kg/m   Constitutional:  Alert and oriented, No acute distress. HEENT: [place] AT, moist mucus membranes.  Trachea midline, no masses. Cardiovascular: No clubbing, cyanosis, or edema. Respiratory: Normal respiratory effort, no increased work of breathing. Skin: No rashes, bruises or suspicious lesions. Neurologic: Grossly intact, no focal deficits, moving all 4 extremities. Psychiatric: Normal mood and affect.    Assessment & Plan:    1. Benign prostatic hyperplasia with LUTS  No significant improvement on tamsulosin  Most bothersome symptoms are storage related  Trial Myrbetriq 25 mg daily  Follow-up 4 weeks for symptom reassessment/IPSS and will perform cystoscopy at that visit if still  symptomatic  2.  Elevated PSA  Mildly elevated PSA which is back to baseline  Desires to continue surveillance   Abbie Sons, [place] [address], [place],  [address] [phone]

## 2020-06-07 ENCOUNTER — Other Ambulatory Visit: Payer: Self-pay

## 2020-06-07 ENCOUNTER — Encounter: Payer: Self-pay | Admitting: Urology

## 2020-06-07 ENCOUNTER — Ambulatory Visit: Payer: Medicare Other | Admitting: Urology

## 2020-06-07 VITALS — BP 138/77 | HR 65 | Ht 70.0 in | Wt 200.0 lb

## 2020-06-07 DIAGNOSIS — R972 Elevated prostate specific antigen [PSA]: Secondary | ICD-10-CM

## 2020-06-07 DIAGNOSIS — N401 Enlarged prostate with lower urinary tract symptoms: Secondary | ICD-10-CM | POA: Diagnosis not present

## 2020-06-07 MED ORDER — MIRABEGRON ER 25 MG PO TB24: 25.0000 mg | ORAL_TABLET | Freq: Every day | ORAL | 11 refills | Status: DC

## 2020-06-08 ENCOUNTER — Encounter: Payer: Self-pay | Admitting: Urology

## 2020-06-08 NOTE — Progress Notes (Signed)
   06/07/2020 9:01 AM   Diona Foley 19-Nov-1950 [id number]  Referring provider: Sofie Hartigan, MD [place],  [place] [address]  Chief Complaint  Patient presents with  . Cysto    HPI: Seen 05/10/2020 with bothersome storage related voiding symptoms.   Trial Myrbetriq 25 mg  Presents for follow-up symptom check and was to have cystoscopy for persistent symptoms  Urinalysis was unremarkable  Noted almost complete resolution of his storage related voiding symptoms on Myrbetriq 25 mg and currently satisfied with his voiding pattern   PMH: Past Medical History:  Diagnosis Date  . Gout   . Hyperlipidemia   . Immune deficiency disorder ([place])   . Kidney disease     Surgical History: Past Surgical History:  Procedure Laterality Date  . BACK SURGERY      Home Medications:  Allergies as of 06/07/2020      Reactions   Lisinopril    Other reaction(s): Dizziness   Doxycycline Diarrhea, Nausea And Vomiting, Nausea Only   Other reaction(s): Dizziness      Medication List       Accurate as of June 07, 2020 11:59 PM. If you have any questions, ask your nurse or doctor.        allopurinol 100 MG tablet Commonly known as: ZYLOPRIM Take 100 mg by mouth daily.   Aspirin 81 81 MG EC tablet Generic drug: aspirin Take 81 mg by mouth.   cholecalciferol 10 MCG (400 UNIT) Tabs tablet Commonly known as: VITAMIN D3 Take 400 Units by mouth.   Lipitor 40 MG tablet Generic drug: atorvastatin Take 40 mg by mouth.   lisinopril 5 MG tablet Commonly known as: ZESTRIL Take 5 mg by mouth daily.   mirabegron ER 25 MG Tb24 tablet Commonly known as: MYRBETRIQ Take 1 tablet (25 mg total) by mouth daily. Started by: Abbie Sons, MD   ranitidine 150 MG capsule Commonly known as: ZANTAC Take 150 mg by mouth.   tamsulosin 0.4 MG Caps capsule Commonly known as: FLOMAX TAKE 1 CAPSULE BY MOUTH EVERY DAY   Uloric 40 MG tablet Generic drug: febuxostat Take  40 mg by mouth.   Viagra 100 MG tablet Generic drug: sildenafil Take by mouth.       Allergies:  Allergies  Allergen Reactions  . Lisinopril     Other reaction(s): Dizziness  . Doxycycline Diarrhea, Nausea And Vomiting and Nausea Only    Other reaction(s): Dizziness    Family History: Family History  Problem Relation Age of Onset  . Rashes / Skin problems Mother   . Rashes / Skin problems Father     Social History:  reports that he has been smoking. He has never used smokeless tobacco. He reports current alcohol use. He reports that he does not use drugs.   Physical Exam: BP 138/77   Pulse 65   Ht 5\' 10"  (1.778 m)   Wt 200 lb (90.7 kg)   BMI 28.70 kg/m   Constitutional:  Alert and oriented, No acute distress.   Assessment & Plan:   1.  BPH with LUTS  Marked improvement on Myrbetriq  Rx sent to pharmacy  Follow-up July 2022 for repeat PSA, bladder scan and [place], MD  [facility] [address] [place], [place] [address] [phone]

## 2020-06-09 ENCOUNTER — Telehealth: Payer: Self-pay

## 2020-06-09 NOTE — Telephone Encounter (Signed)
Spoke with patients wife. She states myrbetriq is expensive. She is wondering if there is an alternative medication that works well.  She states he is doing well with myrbetriq. In the meantime I gave her a month worth of samples for Peter Richard.

## 2020-06-12 NOTE — Telephone Encounter (Signed)
Patient wife will come tomorrow to get one more month of Myrbetriq 25

## 2020-06-12 NOTE — Telephone Encounter (Signed)
The only alternative medications that would be less expensive would be in the anticholinergic class which would have higher side effects including dry mouth and constipation.    If they want to try can send in solifenacin 10 mg daily #30 tabs

## 2020-07-11 DIAGNOSIS — U071 COVID-19: Secondary | ICD-10-CM

## 2020-07-11 HISTORY — DX: COVID-19: U07.1

## 2020-07-12 ENCOUNTER — Encounter (INDEPENDENT_AMBULATORY_CARE_PROVIDER_SITE_OTHER): Payer: Self-pay

## 2020-07-12 ENCOUNTER — Encounter (INDEPENDENT_AMBULATORY_CARE_PROVIDER_SITE_OTHER): Payer: Self-pay | Admitting: Nurse Practitioner

## 2020-07-20 ENCOUNTER — Other Ambulatory Visit: Payer: Self-pay

## 2020-08-01 ENCOUNTER — Other Ambulatory Visit (INDEPENDENT_AMBULATORY_CARE_PROVIDER_SITE_OTHER): Payer: Self-pay | Admitting: Nurse Practitioner

## 2020-08-01 DIAGNOSIS — I83813 Varicose veins of bilateral lower extremities with pain: Secondary | ICD-10-CM

## 2020-08-02 ENCOUNTER — Other Ambulatory Visit: Payer: Self-pay

## 2020-08-02 ENCOUNTER — Ambulatory Visit (INDEPENDENT_AMBULATORY_CARE_PROVIDER_SITE_OTHER): Payer: Medicare Other | Admitting: Nurse Practitioner

## 2020-08-02 ENCOUNTER — Ambulatory Visit (INDEPENDENT_AMBULATORY_CARE_PROVIDER_SITE_OTHER): Payer: Medicare Other

## 2020-08-02 ENCOUNTER — Encounter (INDEPENDENT_AMBULATORY_CARE_PROVIDER_SITE_OTHER): Payer: Self-pay | Admitting: Nurse Practitioner

## 2020-08-02 VITALS — BP 177/65 | HR 65 | Ht 70.0 in | Wt 207.0 lb

## 2020-08-02 DIAGNOSIS — I83813 Varicose veins of bilateral lower extremities with pain: Secondary | ICD-10-CM

## 2020-08-02 DIAGNOSIS — E785 Hyperlipidemia, unspecified: Secondary | ICD-10-CM | POA: Diagnosis not present

## 2020-08-02 DIAGNOSIS — H9190 Unspecified hearing loss, unspecified ear: Secondary | ICD-10-CM | POA: Insufficient documentation

## 2020-08-03 ENCOUNTER — Other Ambulatory Visit: Payer: Self-pay | Admitting: *Deleted

## 2020-08-06 ENCOUNTER — Encounter (INDEPENDENT_AMBULATORY_CARE_PROVIDER_SITE_OTHER): Payer: Self-pay | Admitting: Nurse Practitioner

## 2020-08-06 NOTE — Progress Notes (Signed)
Subjective:    Patient ID: Peter Richard, male    DOB: 1950/09/16, 70 y.o.   MRN: [id number] Chief Complaint  Patient presents with   New Patient (Initial Visit)    VV with pain LE VEN reflux     The patient is seen for evaluation of symptomatic varicose veins. The patient relates burning and stinging which worsened steadily throughout the course of the day, particularly with standing. The patient also notes an aching and throbbing pain over the varicosities, particularly with prolonged dependent positions. The symptoms are significantly improved with elevation.  The patient also notes that during hot weather the symptoms are greatly intensified.  The patient also notes that the left lower extremity is extremely itchy as well.  The patient states the pain from the varicose veins interferes with work, daily exercise, shopping and household maintenance. At this point, the symptoms are persistent and severe enough that they're having a negative impact on lifestyle and are interfering with daily activities.  There is no history of DVT, PE or superficial thrombophlebitis. There is no history of ulceration or hemorrhage. The patient denies a significant family history of varicose veins.   The patient has worn graduated compression in the past. At the present time the patient has been using over-the-counter analgesics. There is no history of prior surgical intervention or sclerotherapy.  The patient has no evidence of DVT or superficial venous thrombosis bilaterally.  There is evidence of deep venous insufficiency in the right popliteal vein and the common femoral vein in the left.  The patient has evidence of reflux in the right great saphenous vein at the saphenofemoral junction extending to the proximal calf.  The patient also has reflux in the great saphenous vein at the saphenofemoral junction extending to the proximal calf as well.    Review of Systems  Cardiovascular: Positive for leg  swelling.  All other systems reviewed and are negative.      Objective:   Physical Exam Vitals reviewed.  HENT:     Head: Normocephalic.  Cardiovascular:     Rate and Rhythm: Normal rate.     Pulses: Normal pulses.  Pulmonary:     Effort: Pulmonary effort is normal.  Skin:    General: Skin is warm and dry.     Comments: Large varicosities LLE  Neurological:     Mental Status: He is alert and oriented to person, place, and time.  Psychiatric:        Mood and Affect: Mood normal.        Behavior: Behavior normal.        Thought Content: Thought content normal.        Judgment: Judgment normal.     BP (!) 177/65    Pulse 65    Ht '5\' 10"'$  (1.778 m)    Wt 207 lb (93.9 kg)    BMI 29.70 kg/m   Past Medical History:  Diagnosis Date   Gout    Hyperlipidemia    Immune deficiency disorder ([place])    Kidney disease     Social History   Socioeconomic History   Marital status: Married    Spouse name: Not on file   Number of children: Not on file   Years of education: Not on file   Highest education level: Not on file  Occupational History   Not on file  Tobacco Use   Smoking status: Current Every Day Smoker   Smokeless tobacco: Never Used  Substance and Sexual Activity  Alcohol use: Yes    Comment: rarely   Drug use: No   Sexual activity: Not on file  Other Topics Concern   Not on file  Social History Narrative   Not on file   Social Determinants of Health   Financial Resource Strain: Not on file  Food Insecurity: Not on file  Transportation Needs: Not on file  Physical Activity: Not on file  Stress: Not on file  Social Connections: Not on file  Intimate Partner Violence: Not on file    Past Surgical History:  Procedure Laterality Date   BACK SURGERY      Family History  Problem Relation Age of Onset   Rashes / Skin problems Mother    Rashes / Skin problems Father     Allergies  Allergen Reactions   Lisinopril     Other  reaction(s): Dizziness   Doxycycline Diarrhea, Nausea And Vomiting and Nausea Only    Other reaction(s): Dizziness    CBC Latest Ref Rng & Units 02/08/2015  WBC 3.8 - 10.6 K/uL 20.3(H)  Hemoglobin 13.0 - 18.0 g/dL 13.6  Hematocrit 40.0 - 52.0 % 40.3  Platelets 150 - 440 K/uL 239      CMP     Component Value Date/Time   CREATININE 1.60 (H) 07/07/2019 1659     No results found.     Assessment & Plan:   1. Varicose veins of bilateral lower extremities with pain Recommend  I have reviewed my previous  discussion with the patient regarding  varicose veins and why they cause symptoms. Patient will continue  wearing graduated compression stockings class 1 on a daily basis, beginning first thing in the morning and removing them in the evening.    In addition, behavioral modification including elevation during the day was again discussed and this will continue.  The patient has utilized over the counter pain medications and has been exercising.  However, at this time conservative therapy has not alleviated the patient's symptoms of leg pain and swelling  Recommend: laser ablation of the left great saphenous veins to eliminate the symptoms of pain and swelling of the lower extremities caused by the severe superficial venous reflux disease.   2. Hyperlipidemia, unspecified hyperlipidemia type Continue statin as ordered and reviewed, no changes at this time    Current Outpatient Medications on File Prior to Visit  Medication Sig Dispense Refill   allopurinol (ZYLOPRIM) 100 MG tablet Take 100 mg by mouth daily.     ascorbic acid (VITAMIN C) 1000 MG tablet Take by mouth.     aspirin 81 MG EC tablet Take 81 mg by mouth.     atorvastatin (LIPITOR) 40 MG tablet Take 40 mg by mouth.     cefdinir (OMNICEF) 300 MG capsule      cetirizine (ZYRTEC) 10 MG tablet Take by mouth.     cholecalciferol (VITAMIN D) 400 UNITS TABS tablet Take 400 Units by mouth.     famotidine (PEPCID)  20 MG tablet Take by mouth.     febuxostat (ULORIC) 40 MG tablet Take 40 mg by mouth.     lisinopril (ZESTRIL) 5 MG tablet Take 5 mg by mouth daily.     losartan (COZAAR) 25 MG tablet Take 25 mg by mouth daily.     mirabegron ER (MYRBETRIQ) 25 MG TB24 tablet Take 1 tablet (25 mg total) by mouth daily. 30 tablet 11   Oxymetazoline HCl (NASAL SPRAY) 0.05 % SOLN Place into the nose.  sildenafil (VIAGRA) 100 MG tablet Take by mouth.     tadalafil (CIALIS) 20 MG tablet Take by mouth.     valsartan (DIOVAN) 40 MG tablet Take by mouth.     ranitidine (ZANTAC) 150 MG capsule Take 150 mg by mouth.     No current facility-administered medications on file prior to visit.    There are no Patient Instructions on file for this visit. No follow-ups on file.   Kris Hartmann, NP

## 2020-08-06 NOTE — Progress Notes (Incomplete)
Subjective:    Patient ID: Peter Richard, male    DOB: Mar 08, 1951, 70 y.o.   MRN: [id number] Chief Complaint  Patient presents with  . New Patient (Initial Visit)    VV with pain LE VEN reflux     The patient is seen for evaluation of symptomatic varicose veins. The patient relates burning and stinging which worsened steadily throughout the course of the day, particularly with standing. The patient also notes an aching and throbbing pain over the varicosities, particularly with prolonged dependent positions. The symptoms are significantly improved with elevation.  The patient also notes that during hot weather the symptoms are greatly intensified. The patient states the pain from the varicose veins interferes with work, daily exercise, shopping and household maintenance. At this point, the symptoms are persistent and severe enough that they're having a negative impact on lifestyle and are interfering with daily activities.  There is no history of DVT, PE or superficial thrombophlebitis. There is no history of ulceration or hemorrhage. The patient denies a significant family history of varicose veins.   The patient has worn graduated compression in the past. At the present time the patient has been using over-the-counter analgesics. There is no history of prior surgical intervention or sclerotherapy.  The     Review of Systems  Cardiovascular: Positive for leg swelling.  All other systems reviewed and are negative.      Objective:   Physical Exam Vitals reviewed.  HENT:     Head: Normocephalic.  Cardiovascular:     Rate and Rhythm: Normal rate.     Pulses: Normal pulses.  Pulmonary:     Effort: Pulmonary effort is normal.  Skin:    General: Skin is warm and dry.     Comments: Large varicosities LLE  Neurological:     Mental Status: He is alert and oriented to person, place, and time.  Psychiatric:        Mood and Affect: Mood normal.        Behavior: Behavior normal.         Thought Content: Thought content normal.        Judgment: Judgment normal.     BP (!) 177/65   Pulse 65   Ht '5\' 10"'$  (1.778 m)   Wt 207 lb (93.9 kg)   BMI 29.70 kg/m   Past Medical History:  Diagnosis Date  . Gout   . Hyperlipidemia   . Immune deficiency disorder ([place])   . Kidney disease     Social History   Socioeconomic History  . Marital status: Married    Spouse name: Not on file  . Number of children: Not on file  . Years of education: Not on file  . Highest education level: Not on file  Occupational History  . Not on file  Tobacco Use  . Smoking status: Current Every Day Smoker  . Smokeless tobacco: Never Used  Substance and Sexual Activity  . Alcohol use: Yes    Comment: rarely  . Drug use: No  . Sexual activity: Not on file  Other Topics Concern  . Not on file  Social History Narrative  . Not on file   Social Determinants of Health   Financial Resource Strain: Not on file  Food Insecurity: Not on file  Transportation Needs: Not on file  Physical Activity: Not on file  Stress: Not on file  Social Connections: Not on file  Intimate Partner Violence: Not on file    Past Surgical  History:  Procedure Laterality Date  . BACK SURGERY      Family History  Problem Relation Age of Onset  . Rashes / Skin problems Mother   . Rashes / Skin problems Father     Allergies  Allergen Reactions  . Lisinopril     Other reaction(s): Dizziness  . Doxycycline Diarrhea, Nausea And Vomiting and Nausea Only    Other reaction(s): Dizziness    CBC Latest Ref Rng & Units 02/08/2015  WBC 3.8 - 10.6 K/uL 20.3(H)  Hemoglobin 13.0 - 18.0 g/dL 13.6  Hematocrit 40.0 - 52.0 % 40.3  Platelets 150 - 440 K/uL 239      CMP     Component Value Date/Time   CREATININE 1.60 (H) 07/07/2019 1659     No results found.     Assessment & Plan:   1. Varicose veins of bilateral lower extremities with pain Recommend  I have reviewed my previous  discussion with the  patient regarding  varicose veins and why they cause symptoms. Patient will continue  wearing graduated compression stockings class 1 on a daily basis, beginning first thing in the morning and removing them in the evening.    In addition, behavioral modification including elevation during the day was again discussed and this will continue.  The patient has utilized over the counter pain medications and has been exercising.  However, at this time conservative therapy has not alleviated the patient's symptoms of leg pain and swelling  Recommend: laser ablation of the left great saphenous veins to eliminate the symptoms of pain and swelling of the lower extremities caused by the severe superficial venous reflux disease.   2. Hyperlipidemia, unspecified hyperlipidemia type Continue statin as ordered and reviewed, no changes at this time    Current Outpatient Medications on File Prior to Visit  Medication Sig Dispense Refill  . allopurinol (ZYLOPRIM) 100 MG tablet Take 100 mg by mouth daily.    Marland Kitchen ascorbic acid (VITAMIN C) 1000 MG tablet Take by mouth.    Marland Kitchen aspirin 81 MG EC tablet Take 81 mg by mouth.    Marland Kitchen atorvastatin (LIPITOR) 40 MG tablet Take 40 mg by mouth.    . cefdinir (OMNICEF) 300 MG capsule     . cetirizine (ZYRTEC) 10 MG tablet Take by mouth.    . cholecalciferol (VITAMIN D) 400 UNITS TABS tablet Take 400 Units by mouth.    . famotidine (PEPCID) 20 MG tablet Take by mouth.    . febuxostat (ULORIC) 40 MG tablet Take 40 mg by mouth.    Marland Kitchen lisinopril (ZESTRIL) 5 MG tablet Take 5 mg by mouth daily.    Marland Kitchen losartan (COZAAR) 25 MG tablet Take 25 mg by mouth daily.    . mirabegron ER (MYRBETRIQ) 25 MG TB24 tablet Take 1 tablet (25 mg total) by mouth daily. 30 tablet 11  . Oxymetazoline HCl (NASAL SPRAY) 0.05 % SOLN Place into the nose.    . sildenafil (VIAGRA) 100 MG tablet Take by mouth.    . tadalafil (CIALIS) 20 MG tablet Take by mouth.    . valsartan (DIOVAN) 40 MG tablet Take by  mouth.    . ranitidine (ZANTAC) 150 MG capsule Take 150 mg by mouth.     No current facility-administered medications on file prior to visit.    There are no Patient Instructions on file for this visit. No follow-ups on file.   Kris Hartmann, NP

## 2020-09-07 ENCOUNTER — Other Ambulatory Visit: Payer: Self-pay | Admitting: Family Medicine

## 2020-09-07 ENCOUNTER — Ambulatory Visit
Admission: RE | Admit: 2020-09-07 | Discharge: 2020-09-07 | Disposition: A | Discharge status: Home / Self Care | Payer: Medicare Other | Source: Ambulatory Visit | Attending: Family Medicine | Admitting: Family Medicine

## 2020-09-07 ENCOUNTER — Other Ambulatory Visit: Payer: Self-pay

## 2020-09-07 DIAGNOSIS — M7989 Other specified soft tissue disorders: Secondary | ICD-10-CM

## 2020-09-07 DIAGNOSIS — M79661 Pain in right lower leg: Secondary | ICD-10-CM

## 2020-09-20 ENCOUNTER — Encounter: Payer: Self-pay | Admitting: Urology

## 2020-09-20 ENCOUNTER — Other Ambulatory Visit: Payer: Self-pay

## 2020-09-20 ENCOUNTER — Ambulatory Visit: Payer: Medicare Other | Admitting: Urology

## 2020-09-20 VITALS — BP 133/72 | HR 69 | Ht 70.0 in | Wt 205.0 lb

## 2020-09-20 DIAGNOSIS — R350 Frequency of micturition: Secondary | ICD-10-CM

## 2020-09-20 DIAGNOSIS — R3915 Urgency of urination: Secondary | ICD-10-CM

## 2020-09-20 DIAGNOSIS — N3941 Urge incontinence: Secondary | ICD-10-CM | POA: Diagnosis not present

## 2020-09-20 DIAGNOSIS — I83819 Varicose veins of unspecified lower extremities with pain: Secondary | ICD-10-CM | POA: Insufficient documentation

## 2020-09-20 MED ORDER — GEMTESA 75 MG PO TABS: 75.0000 mg | ORAL_TABLET | Freq: Every day | ORAL | 0 refills | Status: DC

## 2020-09-20 NOTE — Progress Notes (Signed)
09/20/2020 2:45 PM   Peter Richard [birth year] [id number]  Referring provider: Sofie Hartigan, MD [place],  [place] [address]  Chief Complaint  Patient presents with  . Benign Prostatic Hypertrophy    HPI: 70 y.o. male presents for follow-up of lower urinary tract symptoms.   Initially developed bothersome storage elated voiding symptoms and seen November 2021 for frequency, urgency with urge incontinence  Was scheduled for cystoscopy and given Myrbetriq 25 mg samples  On follow-up had almost complete resolution of his symptoms on Myrbetriq and elected to defer cystoscopy  Based on their insurance Myrbetriq was cost prohibitive  They were given additional samples however he has recently developed recurrent storage related voiding symptoms   PMH: Past Medical History:  Diagnosis Date  . Gout   . Hyperlipidemia   . Immune deficiency disorder ([place])   . Kidney disease     Surgical History: Past Surgical History:  Procedure Laterality Date  . BACK SURGERY      Home Medications:  Allergies as of 09/20/2020      Reactions   Lisinopril    Other reaction(s): Dizziness   Doxycycline Diarrhea, Nausea And Vomiting, Nausea Only   Other reaction(s): Dizziness      Medication List       Accurate as of September 20, 2020  2:45 PM. If you have any questions, ask your nurse or doctor.        STOP taking these medications   febuxostat 40 MG tablet Commonly known as: ULORIC Stopped by: Abbie Sons, MD   lisinopril 5 MG tablet Commonly known as: ZESTRIL Stopped by: Abbie Sons, MD   losartan 25 MG tablet Commonly known as: COZAAR Stopped by: Abbie Sons, MD   Nasal Spray 0.05 % Soln Stopped by: Abbie Sons, MD   sildenafil 100 MG tablet Commonly known as: VIAGRA Stopped by: Abbie Sons, MD   valsartan 40 MG tablet Commonly known as: DIOVAN Stopped by: Abbie Sons, MD     TAKE these medications   allopurinol 100 MG  tablet Commonly known as: ZYLOPRIM Take 100 mg by mouth daily.   ascorbic acid 1000 MG tablet Commonly known as: VITAMIN C Take by mouth.   aspirin 81 MG EC tablet Take 81 mg by mouth.   atorvastatin 40 MG tablet Commonly known as: LIPITOR Take 40 mg by mouth.   cefdinir 300 MG capsule Commonly known as: OMNICEF   cetirizine 10 MG tablet Commonly known as: ZYRTEC Take by mouth.   cholecalciferol 10 MCG (400 UNIT) Tabs tablet Commonly known as: VITAMIN D3 Take 400 Units by mouth.   famotidine 20 MG tablet Commonly known as: PEPCID Take by mouth.   mirabegron ER 25 MG Tb24 tablet Commonly known as: MYRBETRIQ Take 1 tablet (25 mg total) by mouth daily.   ranitidine 150 MG capsule Commonly known as: ZANTAC Take 150 mg by mouth.   tadalafil 20 MG tablet Commonly known as: CIALIS Take by mouth.       Allergies:  Allergies  Allergen Reactions  . Lisinopril     Other reaction(s): Dizziness  . Doxycycline Diarrhea, Nausea And Vomiting and Nausea Only    Other reaction(s): Dizziness    Family History: Family History  Problem Relation Age of Onset  . Rashes / Skin problems Mother   . Rashes / Skin problems Father     Social History:  reports that he has been smoking. He has never used smokeless  tobacco. He reports current alcohol use. He reports that he does not use drugs.   Physical Exam: BP 133/72   Pulse 69   Ht '5\' 10"'$  (1.778 m)   Wt 205 lb (93 kg)   BMI 29.41 kg/m   Constitutional:  Alert and oriented, No acute distress. HEENT: [place] AT, moist mucus membranes.  Trachea midline, no masses. Cardiovascular: No clubbing, cyanosis, or edema. Respiratory: Normal respiratory effort, no increased work of breathing.   Assessment & Plan:    1.  Lower urinary tract symptoms  Bothersome storage related voiding symptoms  No improvement on anticholinergic medication and Myrbetriq  Will schedule UDS at [facility]  In the meantime trial Gemtesa 75 mg  daily and instructed to discontinue this medication 2 weeks prior to UDS  Reschedule cystoscopy after Pena Pob[place], MD  [facility] [address], [place], [place] [address] [phone]

## 2020-09-20 NOTE — Progress Notes (Signed)
    MRN : [id number]  Deontez Easterly is a 70 y.o. (06-01-1951) male who presents with chief complaint of painful varicose veins.    The patient's left lower extremity was sterilely prepped and draped.  The ultrasound machine was used to visualize the left great saphenous vein throughout its course.  A segment at the mid calf was selected for access.  The saphenous vein was accessed without difficulty using ultrasound guidance with a micropuncture needle.   An 0.018  wire was placed beyond the saphenofemoral junction through the sheath and the microneedle was removed.  The 65 cm sheath was then placed over the wire and the wire and dilator were removed.  The laser fiber was placed through the sheath and its tip was placed approximately 2 cm below the saphenofemoral junction.  Tumescent anesthesia was then created with a dilute lidocaine solution.  Laser energy was then delivered with constant withdrawal of the sheath and laser fiber.  Approximately 1946 Joules of energy were delivered over a length of 51 cm.  Sterile dressings were placed.  The patient tolerated the procedure well without complications.

## 2020-09-21 ENCOUNTER — Encounter (INDEPENDENT_AMBULATORY_CARE_PROVIDER_SITE_OTHER): Payer: Self-pay | Admitting: Vascular Surgery

## 2020-09-21 ENCOUNTER — Ambulatory Visit (INDEPENDENT_AMBULATORY_CARE_PROVIDER_SITE_OTHER): Payer: Medicare Other | Admitting: Vascular Surgery

## 2020-09-21 DIAGNOSIS — I83819 Varicose veins of unspecified lower extremities with pain: Secondary | ICD-10-CM

## 2020-09-21 DIAGNOSIS — I83812 Varicose veins of left lower extremities with pain: Secondary | ICD-10-CM | POA: Diagnosis not present

## 2020-09-21 HISTORY — PX: VENOUS ABLATION: SHX2656

## 2020-09-21 LAB — URINALYSIS, COMPLETE
Bilirubin, UA: NEGATIVE
Glucose, UA: NEGATIVE
Ketones, UA: NEGATIVE
Leukocytes,UA: NEGATIVE
Nitrite, UA: NEGATIVE
Specific Gravity, UA: 1.025 (ref 1.005–1.030)
Urobilinogen, Ur: 0.2 mg/dL (ref 0.2–1.0)
pH, UA: 5 (ref 5.0–7.5)

## 2020-09-21 LAB — MICROSCOPIC EXAMINATION
Bacteria, UA: NONE SEEN
Epithelial Cells (non renal): NONE SEEN /hpf (ref 0–10)

## 2020-09-27 ENCOUNTER — Other Ambulatory Visit (INDEPENDENT_AMBULATORY_CARE_PROVIDER_SITE_OTHER): Payer: Self-pay | Admitting: Vascular Surgery

## 2020-09-27 DIAGNOSIS — I83819 Varicose veins of unspecified lower extremities with pain: Secondary | ICD-10-CM

## 2020-09-28 ENCOUNTER — Ambulatory Visit (INDEPENDENT_AMBULATORY_CARE_PROVIDER_SITE_OTHER): Payer: Medicare Other

## 2020-09-28 ENCOUNTER — Other Ambulatory Visit: Payer: Self-pay

## 2020-09-28 DIAGNOSIS — I83819 Varicose veins of unspecified lower extremities with pain: Secondary | ICD-10-CM | POA: Diagnosis not present

## 2020-10-12 ENCOUNTER — Other Ambulatory Visit (INDEPENDENT_AMBULATORY_CARE_PROVIDER_SITE_OTHER): Payer: Medicare Other | Admitting: Vascular Surgery

## 2020-10-19 ENCOUNTER — Other Ambulatory Visit: Payer: Self-pay | Admitting: Sports Medicine

## 2020-10-19 ENCOUNTER — Encounter (INDEPENDENT_AMBULATORY_CARE_PROVIDER_SITE_OTHER): Payer: Medicare Other

## 2020-10-19 DIAGNOSIS — M25461 Effusion, right knee: Secondary | ICD-10-CM

## 2020-10-19 DIAGNOSIS — M25561 Pain in right knee: Secondary | ICD-10-CM

## 2020-10-19 DIAGNOSIS — W19XXXD Unspecified fall, subsequent encounter: Secondary | ICD-10-CM

## 2020-10-23 ENCOUNTER — Telehealth: Payer: Self-pay | Admitting: Urology

## 2020-10-23 NOTE — Telephone Encounter (Signed)
LM TO CB TO SCHEDULE UDS RESULTS APP  PT IS SCHEDULED FOR 10/26/20 IN [place] PLEASE SCHEDULE A WEEK AFTER WITH STO[facility]   THANKS, MICHELLE

## 2020-10-28 ENCOUNTER — Other Ambulatory Visit: Payer: Self-pay

## 2020-10-28 ENCOUNTER — Ambulatory Visit
Admission: RE | Admit: 2020-10-28 | Discharge: 2020-10-28 | Disposition: A | Discharge status: Home / Self Care | Payer: Medicare Other | Source: Ambulatory Visit | Attending: Sports Medicine | Admitting: Sports Medicine

## 2020-10-28 DIAGNOSIS — W19XXXA Unspecified fall, initial encounter: Secondary | ICD-10-CM | POA: Diagnosis not present

## 2020-10-28 DIAGNOSIS — M25561 Pain in right knee: Secondary | ICD-10-CM | POA: Diagnosis present

## 2020-10-28 DIAGNOSIS — M25461 Effusion, right knee: Secondary | ICD-10-CM | POA: Insufficient documentation

## 2020-10-28 DIAGNOSIS — W19XXXD Unspecified fall, subsequent encounter: Secondary | ICD-10-CM

## 2020-10-28 DIAGNOSIS — S83281A Other tear of lateral meniscus, current injury, right knee, initial encounter: Secondary | ICD-10-CM | POA: Diagnosis not present

## 2020-10-30 ENCOUNTER — Other Ambulatory Visit: Payer: Self-pay | Admitting: Urology

## 2020-11-02 ENCOUNTER — Other Ambulatory Visit (INDEPENDENT_AMBULATORY_CARE_PROVIDER_SITE_OTHER): Payer: Medicare Other | Admitting: Vascular Surgery

## 2020-11-09 ENCOUNTER — Encounter (INDEPENDENT_AMBULATORY_CARE_PROVIDER_SITE_OTHER): Payer: Medicare Other

## 2020-11-15 ENCOUNTER — Other Ambulatory Visit: Payer: Self-pay

## 2020-11-15 ENCOUNTER — Encounter: Payer: Self-pay | Admitting: Urology

## 2020-11-15 ENCOUNTER — Ambulatory Visit: Payer: Medicare Other | Admitting: Urology

## 2020-11-15 VITALS — BP 142/80 | HR 58 | Ht 70.0 in | Wt 208.0 lb

## 2020-11-15 DIAGNOSIS — N3281 Overactive bladder: Secondary | ICD-10-CM

## 2020-11-15 DIAGNOSIS — R972 Elevated prostate specific antigen [PSA]: Secondary | ICD-10-CM | POA: Insufficient documentation

## 2020-11-15 NOTE — Progress Notes (Signed)
11/15/2020 9:19 AM   Peter Richard 1951-01-14 [id number]  Referring provider: Sofie Hartigan, MD [place],  [place] [address]  Chief Complaint  Patient presents with  . Urinary Incontinence    HPI: 70 y.o. male presents for follow-up.   Bothersome storage related LUTS  Refer to my prior note 09/20/2020  As directed study performed at [facility] 10/26/20  No significant PVR; for sensation 104 mL, normal desire 125 mL and strong desire 169 mL  Bladder capacity 270 mL  Able to void with a sustained detrusor contraction and no evidence of outlet obstruction  Unstable detrusor contractions with leakage  At last visit given trial Gemtesa which he stopped 2 weeks prior to the procedure and did not restart  Since his last visit and he has retired and has noted less urgency and leakage   PMH: Past Medical History:  Diagnosis Date  . Gout   . Hyperlipidemia   . Immune deficiency disorder ([place])   . Kidney disease     Surgical History: Past Surgical History:  Procedure Laterality Date  . BACK SURGERY      Home Medications:  Allergies as of 11/15/2020      Reactions   Lisinopril    Other reaction(s): Dizziness   Doxycycline Diarrhea, Nausea And Vomiting, Nausea Only   Other reaction(s): Dizziness      Medication List       Accurate as of Nov 15, 2020  9:19 AM. If you have any questions, ask your nurse or doctor.        allopurinol 100 MG tablet Commonly known as: ZYLOPRIM Take 100 mg by mouth daily.   ascorbic acid 1000 MG tablet Commonly known as: VITAMIN C Take by mouth.   aspirin 81 MG EC tablet Take 81 mg by mouth.   atorvastatin 40 MG tablet Commonly known as: LIPITOR Take 40 mg by mouth.   cefdinir 300 MG capsule Commonly known as: OMNICEF   cetirizine 10 MG tablet Commonly known as: ZYRTEC Take by mouth.   cholecalciferol 10 MCG (400 UNIT) Tabs tablet Commonly known as: VITAMIN D3 Take 400 Units by mouth.   famotidine  20 MG tablet Commonly known as: PEPCID Take by mouth.   Gemtesa 75 MG Tabs Generic drug: Vibegron Take 75 mg by mouth daily.   ranitidine 150 MG capsule Commonly known as: ZANTAC Take 150 mg by mouth.   tadalafil 20 MG tablet Commonly known as: CIALIS Take by mouth.       Allergies:  Allergies  Allergen Reactions  . Lisinopril     Other reaction(s): Dizziness  . Doxycycline Diarrhea, Nausea And Vomiting and Nausea Only    Other reaction(s): Dizziness    Family History: Family History  Problem Relation Age of Onset  . Rashes / Skin problems Mother   . Rashes / Skin problems Father     Social History:  reports that he has been smoking. He has never used smokeless tobacco. He reports current alcohol use. He reports that he does not use drugs.   Physical Exam: BP (!) 142/80   Pulse (!) 58   Ht '5\' 10"'$  (1.778 m)   Wt 208 lb (94.3 kg)   BMI 29.84 kg/m   Constitutional:  Alert and oriented, No acute distress. HEENT: [place] AT, moist mucus membranes.  Trachea midline, no masses. Cardiovascular: No clubbing, cyanosis, or edema. Respiratory: Normal respiratory effort, no increased work of breathing.   Assessment & Plan:    1.  Overactive bladder  As directed study with detrusor overactivity  No history to suggest a neurogenic etiology  Has failed medical management  Second line options were discussed including pelvic floor physical therapy, Botox and neuromodulation  Since symptoms are better since he retired he does not desire any additional treatment at this time  Follow-up 6 months for recheck  2.  Elevated PSA  Repeat PSA on follow-up   Abbie Sons, MD  [facility] [address] [place], [place] [address] [phone]

## 2020-12-12 ENCOUNTER — Other Ambulatory Visit: Payer: Self-pay | Admitting: Orthopedic Surgery

## 2020-12-19 ENCOUNTER — Encounter
Admission: RE | Admit: 2020-12-19 | Discharge: 2020-12-19 | Disposition: A | Discharge status: Home / Self Care | Payer: Medicare Other | Source: Ambulatory Visit | Attending: Orthopedic Surgery | Admitting: Orthopedic Surgery

## 2020-12-19 ENCOUNTER — Other Ambulatory Visit: Payer: Self-pay

## 2020-12-19 HISTORY — DX: Cerebral infarction, unspecified: I63.9

## 2020-12-19 HISTORY — DX: Gastro-esophageal reflux disease without esophagitis: K21.9

## 2020-12-19 HISTORY — DX: Male erectile dysfunction, unspecified: N52.9

## 2020-12-19 HISTORY — DX: Essential (primary) hypertension: I10

## 2020-12-19 HISTORY — DX: Unspecified convulsions: R56.9

## 2020-12-19 NOTE — Patient Instructions (Addendum)
Your procedure is scheduled on:  Tuesday, June 28 Report to the Registration Desk on the 1st floor of the [facility]. To find out your arrival time, please call [phone] between 1PM - 3PM on: Monday, June 27  REMEMBER: Instructions that are not followed completely may result in serious medical risk, up to and including death; or upon the discretion of your surgeon and anesthesiologist your surgery may need to be rescheduled.  Do not eat food after midnight the night before surgery.  No gum chewing, lozengers or hard candies.  You may however, drink CLEAR liquids up to 2 hours before you are scheduled to arrive for your surgery. Do not drink anything within 2 hours of your scheduled arrival time.  Clear liquids include: - water  - apple juice without pulp - gatorade (not RED, PURPLE, OR BLUE) - black coffee or tea (Do NOT add milk or creamers to the coffee or tea) Do NOT drink anything that is not on this list.  In addition, your doctor has ordered for you to drink the provided  Ensure Pre-Surgery Clear Carbohydrate Drink  Drinking this carbohydrate drink up to two hours before surgery helps to reduce insulin resistance and improve patient outcomes. Please complete drinking 2 hours prior to scheduled arrival time.  TAKE THESE MEDICATIONS THE MORNING OF SURGERY WITH A SIP OF WATER:  Famotidine (Pepcid) - (take one the night before and one on the morning of surgery - helps to prevent nausea after surgery.)  One week prior to surgery: starting today, June 21 Stop aspirin and Anti-inflammatories (NSAIDS) such as Advil, Aleve, Ibuprofen, Motrin, Naproxen, Naprosyn and Aspirin based products such as Excedrin, Goodys Powder, BC Powder. Stop ANY OVER THE COUNTER supplements until after surgery. (Vitamin C, tums, vitamin D, melatonin, glucosamine, multi-vitamin, fish oil) You may however, continue to take Tylenol if needed for pain up until the day of surgery.  No Alcohol for 24 hours  before or after surgery.  No Smoking including e-cigarettes for 24 hours prior to surgery.  No chewable tobacco products for at least 6 hours prior to surgery.  No nicotine patches on the day of surgery.  Do not use any "recreational" drugs for at least a week prior to your surgery.  Please be advised that the combination of cocaine and anesthesia may have negative outcomes, up to and including death. If you test positive for cocaine, your surgery will be cancelled.  On the morning of surgery brush your teeth with toothpaste and water, you may rinse your mouth with mouthwash if you wish. Do not swallow any toothpaste or mouthwash.  Do not wear jewelry, make-up, hairpins, clips or nail polish.  Do not wear lotions, powders, or perfumes.   Do not shave body from the neck down 48 hours prior to surgery just in case you cut yourself which could leave a site for infection.  Also, freshly shaved skin may become irritated if using the CHG soap.  Contact lenses, hearing aids and dentures may not be worn into surgery.  Do not bring valuables to the hospital. [facility] is not responsible for any missing/lost belongings or valuables.   Use CHG Soap as directed on instruction sheet.  Notify your doctor if there is any change in your medical condition (cold, fever, infection).  Wear comfortable clothing (specific to your surgery type) to the hospital.  After surgery, you can help prevent lung complications by doing breathing exercises.  Take deep breaths and cough every 1-2 hours. Your  doctor may order a device called an Incentive Spirometer to help you take deep breaths.  If you are being discharged the day of surgery, you will not be allowed to drive home. You will need a responsible adult (18 years or older) to drive you home and stay with you that night.   If you are taking public transportation, you will need to have a responsible adult (18 years or older) with you. Please confirm with  your physician that it is acceptable to use public transportation.   Please call the [place] Dept. at [phone] if you have any questions about these instructions.  Surgery Visitation Policy:  Patients undergoing a surgery or procedure may have one family member or support person with them as long as that person is not COVID-19 positive or experiencing its symptoms.  That person may remain in the waiting area during the procedure.

## 2020-12-20 ENCOUNTER — Encounter (HOSPITAL_COMMUNITY): Payer: Self-pay | Admitting: Urgent Care

## 2020-12-20 ENCOUNTER — Encounter: Payer: Self-pay | Admitting: Orthopedic Surgery

## 2021-01-02 ENCOUNTER — Other Ambulatory Visit: Payer: Self-pay

## 2021-01-08 ENCOUNTER — Ambulatory Visit: Payer: Self-pay | Admitting: Urology

## 2021-01-15 ENCOUNTER — Other Ambulatory Visit: Payer: Self-pay | Admitting: Orthopedic Surgery

## 2021-01-22 ENCOUNTER — Other Ambulatory Visit: Payer: Self-pay

## 2021-01-22 ENCOUNTER — Other Ambulatory Visit
Admission: RE | Admit: 2021-01-22 | Discharge: 2021-01-22 | Disposition: A | Discharge status: Home / Self Care | Payer: Medicare Other | Source: Ambulatory Visit | Attending: Orthopedic Surgery | Admitting: Orthopedic Surgery

## 2021-01-22 NOTE — Pre-Procedure Instructions (Signed)
TC to patient and spoke with his wife Belenda Cruise, reviewed chart only, had spoken with PAT nurse last month for this procedure but was cancelled due to CV clearance. Has f/up appointment with Cardio to review echo. Gave same instructions as last month.
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
## 2021-02-01 ENCOUNTER — Ambulatory Visit: Payer: Medicare Other | Admitting: Urgent Care

## 2021-02-01 ENCOUNTER — Ambulatory Visit
Admission: RE | Admit: 2021-02-01 | Discharge: 2021-02-01 | Disposition: A | Discharge status: Home / Self Care | Payer: Medicare Other | Attending: Orthopedic Surgery | Admitting: Orthopedic Surgery

## 2021-02-01 ENCOUNTER — Other Ambulatory Visit: Payer: Self-pay

## 2021-02-01 ENCOUNTER — Encounter
Admission: RE | Disposition: A | Discharge status: Home / Self Care | Payer: Self-pay | Source: Home / Self Care | Attending: Orthopedic Surgery

## 2021-02-01 DIAGNOSIS — I129 Hypertensive chronic kidney disease with stage 1 through stage 4 chronic kidney disease, or unspecified chronic kidney disease: Secondary | ICD-10-CM | POA: Insufficient documentation

## 2021-02-01 DIAGNOSIS — Z8616 Personal history of COVID-19: Secondary | ICD-10-CM | POA: Insufficient documentation

## 2021-02-01 DIAGNOSIS — S83231A Complex tear of medial meniscus, current injury, right knee, initial encounter: Secondary | ICD-10-CM | POA: Insufficient documentation

## 2021-02-01 DIAGNOSIS — X58XXXA Exposure to other specified factors, initial encounter: Secondary | ICD-10-CM | POA: Insufficient documentation

## 2021-02-01 DIAGNOSIS — S83271A Complex tear of lateral meniscus, current injury, right knee, initial encounter: Secondary | ICD-10-CM | POA: Diagnosis not present

## 2021-02-01 DIAGNOSIS — Z7982 Long term (current) use of aspirin: Secondary | ICD-10-CM | POA: Diagnosis not present

## 2021-02-01 DIAGNOSIS — Z881 Allergy status to other antibiotic agents status: Secondary | ICD-10-CM | POA: Diagnosis not present

## 2021-02-01 DIAGNOSIS — Z79899 Other long term (current) drug therapy: Secondary | ICD-10-CM | POA: Diagnosis not present

## 2021-02-01 DIAGNOSIS — Z8719 Personal history of other diseases of the digestive system: Secondary | ICD-10-CM | POA: Insufficient documentation

## 2021-02-01 DIAGNOSIS — E785 Hyperlipidemia, unspecified: Secondary | ICD-10-CM | POA: Insufficient documentation

## 2021-02-01 DIAGNOSIS — N189 Chronic kidney disease, unspecified: Secondary | ICD-10-CM | POA: Insufficient documentation

## 2021-02-01 DIAGNOSIS — Z8673 Personal history of transient ischemic attack (TIA), and cerebral infarction without residual deficits: Secondary | ICD-10-CM | POA: Diagnosis not present

## 2021-02-01 DIAGNOSIS — M1711 Unilateral primary osteoarthritis, right knee: Secondary | ICD-10-CM | POA: Diagnosis not present

## 2021-02-01 DIAGNOSIS — Z888 Allergy status to other drugs, medicaments and biological substances status: Secondary | ICD-10-CM | POA: Diagnosis not present

## 2021-02-01 HISTORY — DX: Nephrotic syndrome with diffuse membranous glomerulonephritis, unspecified: N04.20

## 2021-02-01 HISTORY — DX: Overactive bladder: N32.81

## 2021-02-01 HISTORY — DX: Bradycardia, unspecified: R00.1

## 2021-02-01 HISTORY — DX: Other forms of angina pectoris: I20.89

## 2021-02-01 HISTORY — DX: Chest pain, unspecified: R07.9

## 2021-02-01 HISTORY — DX: Other forms of angina pectoris: I20.8

## 2021-02-01 HISTORY — PX: KNEE ARTHROSCOPY WITH MEDIAL MENISECTOMY: SHX5651

## 2021-02-01 HISTORY — DX: Chronic kidney disease, stage 4 (severe): N18.4

## 2021-02-01 HISTORY — DX: Nephrotic syndrome with diffuse membranous glomerulonephritis: N04.2

## 2021-02-01 SURGERY — ARTHROSCOPY, KNEE, WITH MEDIAL MENISCECTOMY
Anesthesia: Choice | Site: Knee | Laterality: Right

## 2021-02-01 SURGERY — ARTHROSCOPY, KNEE, WITH MEDIAL MENISCECTOMY
Anesthesia: General | Site: Knee | Laterality: Right

## 2021-02-01 MED ORDER — BUPIVACAINE-EPINEPHRINE (PF) 0.5% -1:200000 IJ SOLN
INTRAMUSCULAR | Status: DC | PRN
  Administered 2021-02-01: 20 mL via PERINEURAL

## 2021-02-01 MED ORDER — FENTANYL CITRATE (PF) 100 MCG/2ML IJ SOLN
INTRAMUSCULAR | Status: AC
  Filled 2021-02-01: qty 2

## 2021-02-01 MED ORDER — OXYCODONE HCL 5 MG/5ML PO SOLN: 5.0000 mg | Freq: Once | ORAL | Status: AC | PRN

## 2021-02-01 MED ORDER — CEFAZOLIN SODIUM-DEXTROSE 2-4 GM/100ML-% IV SOLN
INTRAVENOUS | Status: AC
  Filled 2021-02-01: qty 100

## 2021-02-01 MED ORDER — ONDANSETRON HCL 4 MG/2ML IJ SOLN
INTRAMUSCULAR | Status: DC | PRN
  Administered 2021-02-01: 4 mg via INTRAVENOUS

## 2021-02-01 MED ORDER — BUPIVACAINE-EPINEPHRINE (PF) 0.5% -1:200000 IJ SOLN
INTRAMUSCULAR | Status: AC
  Filled 2021-02-01: qty 30

## 2021-02-01 MED ORDER — LACTATED RINGERS IR SOLN
Status: DC | PRN
  Administered 2021-02-01 (×2): 3000 mL

## 2021-02-01 MED ORDER — LACTATED RINGERS IV SOLN: INTRAVENOUS | Status: DC

## 2021-02-01 MED ORDER — CEFAZOLIN SODIUM-DEXTROSE 2-4 GM/100ML-% IV SOLN
2.0000 g | INTRAVENOUS | Status: AC
  Administered 2021-02-01: 2 g via INTRAVENOUS

## 2021-02-01 MED ORDER — ORAL CARE MOUTH RINSE: 15.0000 mL | Freq: Once | OROMUCOSAL | Status: AC

## 2021-02-01 MED ORDER — PROPOFOL 10 MG/ML IV BOLUS
INTRAVENOUS | Status: AC
  Filled 2021-02-01: qty 20

## 2021-02-01 MED ORDER — FENTANYL CITRATE (PF) 100 MCG/2ML IJ SOLN
25.0000 ug | INTRAMUSCULAR | Status: DC | PRN
  Administered 2021-02-01 (×3): 25 ug via INTRAVENOUS

## 2021-02-01 MED ORDER — DEXAMETHASONE SODIUM PHOSPHATE 10 MG/ML IJ SOLN
INTRAMUSCULAR | Status: DC | PRN
  Administered 2021-02-01: 4 mg via INTRAVENOUS

## 2021-02-01 MED ORDER — OXYCODONE HCL 5 MG PO TABS
5.0000 mg | ORAL_TABLET | Freq: Once | ORAL | Status: AC | PRN
  Administered 2021-02-01: 5 mg via ORAL

## 2021-02-01 MED ORDER — HYDROCODONE-ACETAMINOPHEN 5-325 MG PO TABS: 1.0000 | ORAL_TABLET | Freq: Four times a day (QID) | ORAL | 0 refills | Status: DC | PRN

## 2021-02-01 MED ORDER — FENTANYL CITRATE (PF) 100 MCG/2ML IJ SOLN
INTRAMUSCULAR | Status: DC | PRN
  Administered 2021-02-01: 100 ug via INTRAVENOUS

## 2021-02-01 MED ORDER — ACETAMINOPHEN 10 MG/ML IV SOLN: 1000.0000 mg | Freq: Once | INTRAVENOUS | Status: DC | PRN

## 2021-02-01 MED ORDER — PROPOFOL 10 MG/ML IV BOLUS
INTRAVENOUS | Status: DC | PRN
  Administered 2021-02-01: 50 mg via INTRAVENOUS
  Administered 2021-02-01: 150 mg via INTRAVENOUS

## 2021-02-01 MED ORDER — MIDAZOLAM HCL 2 MG/2ML IJ SOLN
INTRAMUSCULAR | Status: AC
  Filled 2021-02-01: qty 2

## 2021-02-01 MED ORDER — ONDANSETRON HCL 4 MG/2ML IJ SOLN: 4.0000 mg | Freq: Once | INTRAMUSCULAR | Status: DC | PRN

## 2021-02-01 MED ORDER — CHLORHEXIDINE GLUCONATE 0.12 % MT SOLN: 15.0000 mL | Freq: Once | OROMUCOSAL | Status: AC

## 2021-02-01 MED ORDER — CHLORHEXIDINE GLUCONATE 0.12 % MT SOLN
OROMUCOSAL | Status: AC
  Administered 2021-02-01: 15 mL via OROMUCOSAL
  Filled 2021-02-01: qty 15

## 2021-02-01 MED ORDER — FENTANYL CITRATE (PF) 100 MCG/2ML IJ SOLN
INTRAMUSCULAR | Status: AC
  Administered 2021-02-01: 25 ug via INTRAVENOUS
  Filled 2021-02-01: qty 2

## 2021-02-01 MED ORDER — LIDOCAINE HCL (CARDIAC) PF 100 MG/5ML IV SOSY
PREFILLED_SYRINGE | INTRAVENOUS | Status: DC | PRN
  Administered 2021-02-01: 100 mg via INTRAVENOUS

## 2021-02-01 MED ORDER — OXYCODONE HCL 5 MG PO TABS
ORAL_TABLET | ORAL | Status: AC
  Filled 2021-02-01: qty 1

## 2021-02-01 SURGICAL SUPPLY — 30 items
APL PRP STRL LF DISP 70% ISPRP (MISCELLANEOUS) ×1
BLADE INCISOR PLUS 4.5 (BLADE) IMPLANT
BNDG ELASTIC 4X5.8 VLCR STR LF (GAUZE/BANDAGES/DRESSINGS) IMPLANT
CHLORAPREP W/TINT 26 (MISCELLANEOUS) ×2 IMPLANT
CUFF TOURN SGL QUICK 24 (TOURNIQUET CUFF)
CUFF TOURN SGL QUICK 34 (TOURNIQUET CUFF)
CUFF TRNQT CYL 24X4X16.5-23 (TOURNIQUET CUFF) IMPLANT
CUFF TRNQT CYL 34X4.125X (TOURNIQUET CUFF) IMPLANT
DRAPE C-ARMOR (DRAPES) IMPLANT
GAUZE SPONGE 4X4 12PLY STRL (GAUZE/BANDAGES/DRESSINGS) ×2 IMPLANT
GLOVE SURG SYN 9.0  PF PI (GLOVE) ×1
GLOVE SURG SYN 9.0 PF PI (GLOVE) ×1 IMPLANT
GOWN SRG 2XL LVL 4 RGLN SLV (GOWNS) ×1 IMPLANT
GOWN STRL NON-REIN 2XL LVL4 (GOWNS) ×2
GOWN STRL REUS W/ TWL LRG LVL3 (GOWN DISPOSABLE) ×2 IMPLANT
GOWN STRL REUS W/TWL LRG LVL3 (GOWN DISPOSABLE) ×4
IV LACTATED RINGER IRRG 3000ML (IV SOLUTION) ×4
IV LR IRRIG 3000ML ARTHROMATIC (IV SOLUTION) ×2 IMPLANT
KIT TURNOVER KIT A (KITS) IMPLANT
MANIFOLD NEPTUNE II (INSTRUMENTS) ×2 IMPLANT
NEEDLE HYPO 22GX1.5 SAFETY (NEEDLE) ×2 IMPLANT
PACK ARTHROSCOPY KNEE (MISCELLANEOUS) ×2 IMPLANT
SCALPEL PROTECTED #11 DISP (BLADE) ×2 IMPLANT
SPONGE T-LAP 18X18 ~~LOC~~+RFID (SPONGE) ×2 IMPLANT
SUT ETHILON 4-0 (SUTURE) ×2
SUT ETHILON 4-0 FS2 18XMFL BLK (SUTURE) ×1
SUTURE ETHLN 4-0 FS2 18XMF BLK (SUTURE) ×1 IMPLANT
TUBING INFLOW SET DBFLO PUMP (TUBING) ×2 IMPLANT
TUBING OUTFLOW SET DBLFO PUMP (TUBING) ×2 IMPLANT
WAND COBLATION FLOW 50 (SURGICAL WAND) ×2 IMPLANT

## 2021-02-01 NOTE — Op Note (Signed)
02/01/2021  1:32 PM  PATIENT:  Peter Richard  70 y.o. male  PRE-OPERATIVE DIAGNOSIS:  Primary osteoarthritis of right knee M17.11 Complex tear of medial meniscus of right knee as current injury, initial encounter S83.231A Complex tear of lateral meniscus of right knee as current injury, initial encounter S83.271A  POST-OPERATIVE DIAGNOSIS:  Primary osteoarthritis of right knee, Complex tear of medial meniscus of right knee as current injury, initial encounter, Complex tear of lateral meniscus of right knee as current injury, initial encounter   PROCEDURE:  Procedure(s): Right knee medial and lateral meniscectomy (Right)  SURGEON: Laurene Footman, MD  ASSISTANTS: None  ANESTHESIA:   general  EBL:  Total I/O In: 500 [I.V.:500] Out: -   BLOOD ADMINISTERED:none  DRAINS: none   LOCAL MEDICATIONS USED:  MARCAINE     SPECIMEN:  No Specimen  DISPOSITION OF SPECIMEN:  N/A  COUNTS:  YES  TOURNIQUET:  * Missing tourniquet times found for documented tourniquets in log: [id number] *  IMPLANTS: None  DICTATION: .Dragon Dictation patient was brought to the operating room and after adequate general anesthesia was obtained the right leg was placed uroscopic leg holder and the leg prepped and draped in the usual sterile fashion.  After patient identification and timeout procedure were completed an inferior lateral portal was made and the arthroscope introduced.  Initial inspection revealed normal tracking of the patellofemoral joint with some mild chondromalacia centrally on the patella and trochlear groove but no fissuring or exposed bone.  Coming on medially and inferior medial portal was made and on probing there is complex tear involving the anterior posterior third of the medial meniscus that had been a flap tear that extended medial and lateral with flaps that could be distended into the joint with the probe.  Again articular cartilage showed extensive fibrillation but no fissuring or exposed  bone.  The ACL is intact lateral compartment had less significant chondromalacia and wear but there was complex tear of the posterior third similar to the medial meniscus but slightly more lateral away from the notch.  An ArthroCare wand was used to ablate tissue in both medial lateral meniscus to a stable margin after thorough irrigation of the knee all instrumentation was withdrawn the gutters were free of any loose bodies on inspection as well the wounds were closed with simple erupted 4-0 nylon with 20 cc of half percent Sensorcaine infiltrated near the incisions for postop algesia.  Xeroform 4 x 4 web roll and Ace wrap applied.  PLAN OF CARE: Discharge to home after PACU  PATIENT DISPOSITION:  PACU - hemodynamically stable.

## 2021-02-01 NOTE — Discharge Instructions (Addendum)
Pain medicine as directed. Resume aspirin today. Take it easy on walking but you can put as much weight as you feel comfortable on the leg. Call office if having problems Loosen Ace wrap if the knee dressing feels tight but leave the underlying wrap alone.   AMBULATORY SURGERY  DISCHARGE INSTRUCTIONS   The drugs that you were given will stay in your system until tomorrow so for the next 24 hours you should not:  Drive an automobile Make any legal decisions Drink any alcoholic beverage   You may resume regular meals tomorrow.  Today it is better to start with liquids and gradually work up to solid foods.  You may eat anything you prefer, but it is better to start with liquids, then soup and crackers, and gradually work up to solid foods.   Please notify your doctor immediately if you have any unusual bleeding, trouble breathing, redness and pain at the surgery site, drainage, fever, or pain not relieved by medication.    Additional Instructions:        Please contact your physician with any problems or Same Day Surgery at [phone], Monday through Friday 6 am to 4 pm, or [place] [facility] number at [phone].

## 2021-02-01 NOTE — Anesthesia Procedure Notes (Signed)
Procedure Name: LMA Insertion Date/Time: 02/01/2021 12:56 PM Performed by: Lily Peer, Analyssa Downs, CRNA Pre-anesthesia Checklist: Patient identified, Emergency Drugs available, Suction available and Patient being monitored Patient Re-evaluated:Patient Re-evaluated prior to induction Oxygen Delivery Method: Circle system utilized Preoxygenation: Pre-oxygenation with 100% oxygen Induction Type: IV induction Ventilation: Mask ventilation without difficulty LMA: LMA inserted Number of attempts: 1 Placement Confirmation: positive ETCO2 and breath sounds checked- equal and bilateral Tube secured with: Tape Dental Injury: Teeth and Oropharynx as per pre-operative assessment

## 2021-02-01 NOTE — Anesthesia Postprocedure Evaluation (Signed)
Anesthesia Post Note  Patient: Peter Richard  Procedure(s) Performed: Right knee medial and lateral meniscectomy (Right: Knee)  Patient location during evaluation: PACU Anesthesia Type: General Level of consciousness: awake and alert Pain management: pain level controlled Vital Signs Assessment: post-procedure vital signs reviewed and stable Respiratory status: spontaneous breathing, nonlabored ventilation, respiratory function stable and patient connected to nasal cannula oxygen Cardiovascular status: blood pressure returned to baseline and stable Postop Assessment: no apparent nausea or vomiting Anesthetic complications: no   No notable events documented.   Last Vitals:  Vitals:   02/01/21 1415 02/01/21 1432  BP: (!) 150/79 (!) 170/88  Pulse: (!) 50 (!) 57  Resp: 12 14  Temp:  (!) 36.1 C  SpO2: 98% 95%    Last Pain:  Vitals:   02/01/21 1432  TempSrc: Temporal  PainSc: 5                  Arita Miss

## 2021-02-01 NOTE — Anesthesia Preprocedure Evaluation (Addendum)
Anesthesia Evaluation  Patient identified by MRN, date of birth, ID band Patient awake    Reviewed: Allergy & Precautions, NPO status , Patient's Chart, lab work & pertinent test results  History of Anesthesia Complications Negative for: history of anesthetic complications  Airway Mallampati: II  TM Distance: >3 FB Neck ROM: Full    Dental no notable dental hx. (+) Teeth Intact   Pulmonary neg pulmonary ROS, neg sleep apnea, neg COPD, Patient abstained from smoking.Not current smoker, former smoker,    Pulmonary exam normal breath sounds clear to auscultation       Cardiovascular Exercise Tolerance: Poor METShypertension, Pt. on medications + angina (-) CAD and (-) Past MI (-) dysrhythmias  Rhythm:Regular Rate:Normal - Systolic murmurs TRANSTHORACIC ECHOCARDIOGRAM performed on 01/22/2021 1. LVEF 50% 2. Normal left ventricular systolic function 3. Normal right ventricular systolic function 4. Trivial AR, MR, and TR 5. No PR 6. No valvular stenosis 7. No evidence of a pericardial effusion  LEXISCAN performed on 01/18/2021 1. LVEF 43% 2. Regional wall motion reveals normal myocardial thickening and wall motion 3. No artifacts noted 4. Left ventricular cavity size normal 5. No evidence of stress-induced myocardial ischemia or arrhythmia 6. The overall quality of the study is good    Neuro/Psych  Headaches, CVA, No Residual Symptoms negative psych ROS   GI/Hepatic GERD  ,(+)     (-) substance abuse  ,   Endo/Other  neg diabetes  Renal/GU CRFRenal disease     Musculoskeletal   Abdominal   Peds  Hematology   Anesthesia Other Findings Past Medical History: No date: Bradycardia No date: CKD (chronic kidney disease), stage IV ([place]) 07/11/2020: COVID-19 No date: Erectile dysfunction No date: GERD (gastroesophageal reflux disease) No date: Gout No date: Hyperlipidemia No date: Hypertension No date:  Intermittent chest pain No date: 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     Comment:  PLA2R Ab (+) No date: OAB (overactive bladder) No date: Seizure-like activity (HCC)     Comment:  episodes of aphasia No date: Stable angina pectoris (HCC) No date: Stroke [facility])  Reproductive/Obstetrics                            Anesthesia Physical Anesthesia Plan  ASA: 3  Anesthesia Plan: General   Post-op Pain Management:    Induction: Intravenous  PONV Risk Score and Plan: 2 and Ondansetron, Dexamethasone and Treatment may vary due to age or medical condition  Airway Management Planned: LMA  Additional Equipment: None  Intra-op Plan:   Post-operative Plan: Extubation in OR  Informed Consent: I have reviewed the patients History and Physical, chart, labs and discussed the procedure including the risks, benefits and alternatives for the proposed anesthesia with the patient or authorized representative who has indicated his/her understanding and acceptance.     Dental advisory given  Plan Discussed with: CRNA and Surgeon  Anesthesia Plan Comments: (Discussed risks of anesthesia with patient, including PONV, sore throat, lip/dental damage. Rare risks discussed as well, such as cardiorespiratory and neurological sequelae, and allergic reactions. Patient understands.)        Anesthesia Quick Evaluation

## 2021-02-01 NOTE — H&P (Signed)
Chief Complaint  Patient presents with   Pre-op Exam  Scheduled for Right knee medial and lateral meniscectomy 02/01/21 with Dr. Rudene Christians    History of the Present Illness: Peter Richard is a 70 y.o. male here today for history and physical for right knee medial and lateral meniscectomy by Dr. Hessie Knows on 02/01/2021. Patient has medial and lateral meniscus tears in the right knee with mild arthritis in the medial compartment. He is being scheduled for a right knee arthroscopy. The patient states his right knee pain is moderate to severe. He states he fell out of a machine at work approximately 4-5 months ago. The patient states his right knee pain has been progressively worsening. He locates his pain throughout his right knee. The patient presents with his wife who states the patient has difficulty getting up and down from a seated position. She notes they do a lot of yard work. The patient states he has worked all his life and wants to remain active.   The patient denies a history of blood clots.  The patient is retired.  I have reviewed past medical, surgical, social and family history, and allergies as documented in the EMR.  Past Medical History: Past Medical History:  Diagnosis Date   Allergy   COVID-19 07/11/2020   Erectile dysfunction   GERD (gastroesophageal reflux disease)   Gout   History of stroke   Hyperlipidemia   Hypertension   Kidney disease   Past Surgical History: Past Surgical History:  Procedure Laterality Date   ENDOVENOUS ABLATION LEG VEIN Left  09/21/2020 - Dr. Delana Meyer   FRACTURE SURGERY   hemorrhoid surgery   Past Family History: Family History  Problem Relation Age of Onset   Dementia Sister   Medications: Current Outpatient Medications Ordered in Epic  Medication Sig Dispense Refill   allopurinol (ZYLOPRIM) 100 MG tablet Take 100 mg by mouth once daily   amLODIPine (NORVASC) 5 MG tablet Take 5 mg by mouth once daily   ascorbic acid, vitamin C, (VITAMIN  C) 1000 MG tablet Take 1,000 mg by mouth once daily   aspirin 81 MG EC tablet Take 81 mg by mouth once daily   atorvastatin (LIPITOR) 40 MG tablet TAKE 1 TABLET BY MOUTH EVERY DAY AT NIGHT 90 tablet 3   cholecalciferol, vitamin D3, (VITAMIN D3) 125 mcg (5,000 unit) tablet Take 5,000 Units by mouth once daily   diclofenac (VOLTAREN) 1 % topical gel Apply 2 g topically 4 (four) times daily 100 g 0   eplerenone (INSPRA) 25 MG tablet Take 25 mg by mouth once daily   famotidine (PEPCID) 20 MG tablet Take 20 mg by mouth once daily   FUROsemide (LASIX) 40 MG tablet Take 1 tablet by mouth 2 (two) times daily   mirabegron (MYRBETRIQ) 25 mg ER Tablet Take 1 tablet by mouth once daily   multivit-min/ferrous fumarate (MULTI VITAMIN ORAL) Take 1 tablet by mouth once daily   omega-3 fatty acids-fish oil 300-1,000 mg capsule Take 2 g by mouth once daily   oxymetazoline (AFRIN) 0.05 % nasal spray Place into both nostrils 2 (two) times daily   predniSONE (DELTASONE) 10 MG tablet Take 1 tablet (10 mg total) by mouth as directed 6 day taper, 6, 5, 4, 3, 2, 1 21 tablet 0   tadalafiL (CIALIS) 20 MG tablet Take 1 tablet (20 mg total) by mouth once daily as needed for Erectile Dysfunction 30 tablet 3   traMADoL (ULTRAM) 50 mg tablet Take 1 tablet by  mouth every 6 (six) hours as needed   valsartan (DIOVAN) 40 MG tablet Take 1 tablet (40 mg total) by mouth once daily 30 tablet 11   valsartan (DIOVAN) 80 MG tablet Take 1 tablet by mouth once daily   No current Epic-ordered facility-administered medications on file.   Allergies: Allergies  Allergen Reactions   Doxycycline Diarrhea, Nausea, Dizziness and Vomiting   Lisinopril Dizziness    Body mass index is 28.94 kg/m.  Review of Systems: A comprehensive 14 point ROS was performed, reviewed, and the pertinent orthopaedic findings are documented in the HPI.  Vitals:  01/26/21 0859  BP: 122/80    General Physical Examination:  General:  Well developed,  well nourished, no apparent distress, normal affect, normal gait with no antalgic component.   HEENT: Head normocephalic, atraumatic, PERRL.   Abdomen: Soft, non tender, non distended, Bowel sounds present.  Heart: Examination of the heart reveals regular, rate, and rhythm. There is no murmur noted on ascultation. There is a normal apical pulse.  Lungs: Lungs are clear to auscultation. There is no wheeze, rhonchi, or crackles. There is normal expansion of bilateral chest walls.   Musculoskeletal Examination: On exam, right knee ligaments are stable. Pain with McMurray test on the right, negative on the left. Antalgic gait. Positive Thessaly test on the right knee. Slight right knee effusion. Range of motion 0 to 100 degrees. No ligamentous laxity.  Radiographs:  EXAM:  MRI OF THE RIGHT KNEE WITHOUT CONTRAST   TECHNIQUE:  Multiplanar, multisequence MR imaging of the knee was performed. No  intravenous contrast was administered.   COMPARISON:  None.   FINDINGS:  MENISCI   Medial: Complex tear of the posterior horn-body junction of the  medial meniscus with peripheral meniscal extrusion.   Lateral: Severe degeneration of the root of the posterior horn of  the lateral meniscus with possible partial radial tear. Tiny  undersurface tear at the free edge of the posterior horn of the  lateral meniscus.   LIGAMENTS   Cruciates: ACL and PCL are intact.   Collaterals: Medial collateral ligament is intact. Lateral  collateral ligament complex is intact.   CARTILAGE   Patellofemoral: Partial-thickness cartilage loss of the medial and  lateral patellar facets with areas of high-grade partial-thickness  cartilage loss and subchondral reactive marrow changes.   Medial: Mild partial-thickness cartilage loss of the medial  femorotibial compartment.   Lateral:  No chondral defect.   JOINT: No joint effusion. Normal Hoffa's fat-pad. No plical  thickening.   POPLITEAL FOSSA:  Popliteus tendon is intact. Small Baker's cyst.  Small amount of fluid superficial to the gastrocnemius muscle likely  reflecting a leaking Baker's cyst.   EXTENSOR MECHANISM: Intact quadriceps tendon. Intact patellar  tendon. Intact lateral patellar retinaculum. Intact medial patellar  retinaculum. Intact MPFL.   BONES: No aggressive osseous lesion. No fracture or dislocation.   Other: No fluid collection or hematoma. Muscles are normal.  Nonspecific soft tissue edema in the subcutaneous fat  circumferentially around the knee.   IMPRESSION:  1. Complex tear of the posterior horn-body junction of the medial  meniscus with peripheral meniscal extrusion.  2. Severe degeneration of the root of the posterior horn of the  lateral meniscus with possible partial radial tear. Tiny  undersurface tear at the free edge of the posterior horn of the  lateral meniscus.  3. Partial-thickness cartilage loss of the medial and lateral  patellar facets with areas of high-grade partial-thickness cartilage  loss and subchondral reactive  marrow changes.  4. Mild partial-thickness cartilage loss of the medial femorotibial  compartment.   Assessment: ICD-10-CM  1. Complex tear of medial meniscus of right knee as current injury, initial encounter S83.231A  2. Complex tear of lateral meniscus of right knee as current injury, initial encounter S83.271A   Plan: 5. 70 year old male with right knee medial and lateral meniscus tears. Pain has been moderate to severe and no relief with conservative treatment. Risks, benefits, complications of a right knee arthroscopy for partial medial lateral meniscectomy have discussed with patient. Patient has agreed and consented procedure with Dr. Hessie Knows on 02/01/2021    Electronically signed by Feliberto Gottron, [place] at 01/26/2021 1:52 PM EDT  Reviewed  H+P. No changes noted.

## 2021-02-01 NOTE — Transfer of Care (Signed)
Immediate Anesthesia Transfer of Care Note  Patient: Peter Richard  Procedure(s) Performed: Right knee medial and lateral meniscectomy (Right: Knee)  Patient Location: PACU  Anesthesia Type:General  Level of Consciousness: drowsy  Airway & Oxygen Therapy: Patient Spontanous Breathing and Patient connected to face mask oxygen  Post-op Assessment: Report given to RN and Post -op Vital signs reviewed and stable  Post vital signs: Reviewed and stable  Last Vitals:  Vitals Value Taken Time  BP 139/79 02/01/21 1333  Temp 36.3 C 02/01/21 1333  Pulse 51 02/01/21 1333  Resp 14 02/01/21 1333  SpO2 100 % 02/01/21 1333  Vitals shown include unvalidated device data.  Last Pain:  Vitals:   02/01/21 1122  TempSrc: Temporal  PainSc: 2          Complications: No notable events documented.

## 2021-02-02 ENCOUNTER — Encounter: Payer: Self-pay | Admitting: Orthopedic Surgery

## 2021-02-14 ENCOUNTER — Other Ambulatory Visit: Payer: Self-pay | Admitting: Family

## 2021-02-14 DIAGNOSIS — G459 Transient cerebral ischemic attack, unspecified: Secondary | ICD-10-CM

## 2021-02-14 DIAGNOSIS — I679 Cerebrovascular disease, unspecified: Secondary | ICD-10-CM

## 2021-02-24 ENCOUNTER — Other Ambulatory Visit: Payer: Self-pay

## 2021-02-24 ENCOUNTER — Ambulatory Visit
Admission: RE | Admit: 2021-02-24 | Discharge: 2021-02-24 | Disposition: A | Discharge status: Home / Self Care | Payer: Medicare Other | Source: Ambulatory Visit | Attending: Family | Admitting: Family

## 2021-02-24 DIAGNOSIS — I679 Cerebrovascular disease, unspecified: Secondary | ICD-10-CM | POA: Diagnosis present

## 2021-02-24 DIAGNOSIS — G459 Transient cerebral ischemic attack, unspecified: Secondary | ICD-10-CM | POA: Diagnosis not present

## 2021-02-28 ENCOUNTER — Emergency Department: Payer: Medicare Other

## 2021-02-28 ENCOUNTER — Observation Stay: Payer: Medicare Other

## 2021-02-28 ENCOUNTER — Observation Stay
Admission: EM | Admit: 2021-02-28 | Discharge: 2021-03-01 | Disposition: A | Discharge status: Home / Self Care | Payer: Medicare Other | Attending: Hospitalist | Admitting: Hospitalist

## 2021-02-28 ENCOUNTER — Other Ambulatory Visit: Payer: Self-pay

## 2021-02-28 DIAGNOSIS — Z87891 Personal history of nicotine dependence: Secondary | ICD-10-CM | POA: Diagnosis not present

## 2021-02-28 DIAGNOSIS — R29818 Other symptoms and signs involving the nervous system: Secondary | ICD-10-CM | POA: Diagnosis not present

## 2021-02-28 DIAGNOSIS — N184 Chronic kidney disease, stage 4 (severe): Secondary | ICD-10-CM | POA: Insufficient documentation

## 2021-02-28 DIAGNOSIS — Z20822 Contact with and (suspected) exposure to covid-19: Secondary | ICD-10-CM | POA: Diagnosis not present

## 2021-02-28 DIAGNOSIS — R200 Anesthesia of skin: Secondary | ICD-10-CM | POA: Insufficient documentation

## 2021-02-28 DIAGNOSIS — Z79899 Other long term (current) drug therapy: Secondary | ICD-10-CM | POA: Insufficient documentation

## 2021-02-28 DIAGNOSIS — R569 Unspecified convulsions: Secondary | ICD-10-CM | POA: Diagnosis not present

## 2021-02-28 DIAGNOSIS — Z8616 Personal history of COVID-19: Secondary | ICD-10-CM | POA: Diagnosis not present

## 2021-02-28 DIAGNOSIS — I129 Hypertensive chronic kidney disease with stage 1 through stage 4 chronic kidney disease, or unspecified chronic kidney disease: Secondary | ICD-10-CM | POA: Diagnosis not present

## 2021-02-28 DIAGNOSIS — Z7982 Long term (current) use of aspirin: Secondary | ICD-10-CM | POA: Diagnosis not present

## 2021-02-28 DIAGNOSIS — R4182 Altered mental status, unspecified: Secondary | ICD-10-CM

## 2021-02-28 DIAGNOSIS — R479 Unspecified speech disturbances: Secondary | ICD-10-CM | POA: Diagnosis present

## 2021-02-28 LAB — DIFFERENTIAL
Abs Immature Granulocytes: 0.02 10*3/uL (ref 0.00–0.07)
Basophils Absolute: 0 10*3/uL (ref 0.0–0.1)
Basophils Relative: 0 %
Eosinophils Absolute: 0.4 10*3/uL (ref 0.0–0.5)
Eosinophils Relative: 7 %
Immature Granulocytes: 0 %
Lymphocytes Relative: 18 %
Lymphs Abs: 1 10*3/uL (ref 0.7–4.0)
Monocytes Absolute: 0.6 10*3/uL (ref 0.1–1.0)
Monocytes Relative: 10 %
Neutro Abs: 3.4 10*3/uL (ref 1.7–7.7)
Neutrophils Relative %: 65 %

## 2021-02-28 LAB — COMPREHENSIVE METABOLIC PANEL
ALT: 10 U/L (ref 0–44)
AST: 18 U/L (ref 15–41)
Albumin: 2.8 g/dL — ABNORMAL LOW (ref 3.5–5.0)
Alkaline Phosphatase: 46 U/L (ref 38–126)
Anion gap: 6 (ref 5–15)
BUN: 29 mg/dL — ABNORMAL HIGH (ref 8–23)
CO2: 30 mmol/L (ref 22–32)
Calcium: 9 mg/dL (ref 8.9–10.3)
Chloride: 104 mmol/L (ref 98–111)
Creatinine, Ser: 2.01 mg/dL — ABNORMAL HIGH (ref 0.61–1.24)
GFR, Estimated: 35 mL/min — ABNORMAL LOW (ref >60)
Glucose, Bld: 119 mg/dL — ABNORMAL HIGH (ref 70–99)
Potassium: 3.9 mmol/L (ref 3.5–5.1)
Sodium: 140 mmol/L (ref 135–145)
Total Bilirubin: 0.5 mg/dL (ref 0.3–1.2)
Total Protein: 5.1 g/dL — ABNORMAL LOW (ref 6.5–8.1)

## 2021-02-28 LAB — RESP PANEL BY RT-PCR (FLU A&B, COVID) ARPGX2
Influenza A by PCR: NEGATIVE
Influenza B by PCR: NEGATIVE
SARS Coronavirus 2 by RT PCR: NEGATIVE

## 2021-02-28 LAB — CBC
HCT: 30.5 % — ABNORMAL LOW (ref 39.0–52.0)
Hemoglobin: 10.8 g/dL — ABNORMAL LOW (ref 13.0–17.0)
MCH: 31.9 pg (ref 26.0–34.0)
MCHC: 35.4 g/dL (ref 30.0–36.0)
MCV: 90 fL (ref 80.0–100.0)
Platelets: 241 10*3/uL (ref 150–400)
RBC: 3.39 MIL/uL — ABNORMAL LOW (ref 4.22–5.81)
RDW: 13.2 % (ref 11.5–15.5)
WBC: 5.3 10*3/uL (ref 4.0–10.5)
nRBC: 0 % (ref 0.0–0.2)

## 2021-02-28 LAB — PROTIME-INR
INR: 1 (ref 0.8–1.2)
Prothrombin Time: 13.2 seconds (ref 11.4–15.2)

## 2021-02-28 LAB — APTT: aPTT: 31 seconds (ref 24–36)

## 2021-02-28 LAB — TSH: TSH: 4.106 u[IU]/mL (ref 0.350–4.500)

## 2021-02-28 MED ORDER — AMLODIPINE BESYLATE 5 MG PO TABS
5.0000 mg | ORAL_TABLET | Freq: Every day | ORAL | Status: DC
  Administered 2021-03-01: 5 mg via ORAL
  Filled 2021-02-28: qty 1

## 2021-02-28 MED ORDER — ALLOPURINOL 100 MG PO TABS: 200.0000 mg | ORAL_TABLET | Freq: Every day | ORAL | Status: DC

## 2021-02-28 MED ORDER — ONDANSETRON HCL 4 MG/2ML IJ SOLN: 4.0000 mg | Freq: Four times a day (QID) | INTRAMUSCULAR | Status: DC | PRN

## 2021-02-28 MED ORDER — ATORVASTATIN CALCIUM 20 MG PO TABS: 40.0000 mg | ORAL_TABLET | Freq: Every day | ORAL | Status: DC

## 2021-02-28 MED ORDER — FAMOTIDINE 20 MG PO TABS
20.0000 mg | ORAL_TABLET | Freq: Every day | ORAL | Status: DC
  Administered 2021-03-01: 20 mg via ORAL
  Filled 2021-02-28: qty 1

## 2021-02-28 MED ORDER — CLOPIDOGREL BISULFATE 75 MG PO TABS
75.0000 mg | ORAL_TABLET | Freq: Every day | ORAL | Status: DC
  Administered 2021-03-01: 75 mg via ORAL
  Filled 2021-02-28: qty 1

## 2021-02-28 MED ORDER — SODIUM CHLORIDE 0.9% FLUSH: 3.0000 mL | Freq: Once | INTRAVENOUS | Status: DC

## 2021-02-28 MED ORDER — METOCLOPRAMIDE HCL 5 MG/ML IJ SOLN
10.0000 mg | Freq: Once | INTRAMUSCULAR | Status: AC
  Administered 2021-02-28: 10 mg via INTRAVENOUS
  Filled 2021-02-28: qty 2

## 2021-02-28 MED ORDER — ASPIRIN EC 81 MG PO TBEC: 81.0000 mg | DELAYED_RELEASE_TABLET | Freq: Every day | ORAL | Status: DC

## 2021-02-28 MED ORDER — IRBESARTAN 75 MG PO TABS: 75.0000 mg | ORAL_TABLET | Freq: Every day | ORAL | Status: DC

## 2021-02-28 MED ORDER — ALBUTEROL SULFATE (2.5 MG/3ML) 0.083% IN NEBU: 2.5000 mg | INHALATION_SOLUTION | RESPIRATORY_TRACT | Status: DC | PRN

## 2021-02-28 MED ORDER — HEPARIN SODIUM (PORCINE) 5000 UNIT/ML IJ SOLN: 5000.0000 [IU] | Freq: Three times a day (TID) | INTRAMUSCULAR | Status: DC

## 2021-02-28 MED ORDER — CALCIUM CARBONATE ANTACID 500 MG PO CHEW: 1.0000 | CHEWABLE_TABLET | Freq: Every day | ORAL | Status: DC | PRN

## 2021-02-28 MED ORDER — LORAZEPAM 2 MG/ML IJ SOLN
1.0000 mg | Freq: Once | INTRAMUSCULAR | Status: AC
  Administered 2021-02-28: 1 mg via INTRAVENOUS
  Filled 2021-02-28: qty 1

## 2021-02-28 MED ORDER — VALPROATE SODIUM 100 MG/ML IV SOLN
1500.0000 mg | Freq: Once | INTRAVENOUS | Status: AC
  Administered 2021-02-28: 1500 mg via INTRAVENOUS
  Filled 2021-02-28: qty 15

## 2021-02-28 MED ORDER — ACETAMINOPHEN 500 MG PO TABS
1000.0000 mg | ORAL_TABLET | Freq: Three times a day (TID) | ORAL | Status: DC | PRN
  Filled 2021-02-28: qty 2

## 2021-02-28 MED ORDER — ONDANSETRON HCL 4 MG PO TABS: 4.0000 mg | ORAL_TABLET | Freq: Four times a day (QID) | ORAL | Status: DC | PRN

## 2021-02-28 MED ORDER — FUROSEMIDE 40 MG PO TABS: 40.0000 mg | ORAL_TABLET | Freq: Two times a day (BID) | ORAL | Status: DC

## 2021-02-28 NOTE — ED Notes (Signed)
Neruo at bedside

## 2021-02-28 NOTE — ED Notes (Signed)
Resumed care from [place] rn.  Pt alert  Family with pt.  Sinus on monitor.  No acute distress noted.

## 2021-02-28 NOTE — ED Notes (Signed)
Waiting on depacon from pharmacy

## 2021-02-28 NOTE — Code Documentation (Addendum)
This RN (stroke coordinator) was called to triage by triage RN stating that pt was going to be considered a code stroke, upon arrival to triage room, pt was speaking but words were slurred and not comprehensible. Pts wife stated that they were riding in the car when he had a sudden onset of left arm numbness and slurred speech. Pt was then brought to ed via pov with wife. Pt arrived, triage done and pt was taken to CT by this RN and EDT. Pt was then taken to room 15 by this RN and met by Dr Leonel Ramsay in the room. When assessment was completed by Neurology sx's had resolved.

## 2021-02-28 NOTE — ED Notes (Signed)
Pt attempting to get out of bed, unable to redirect. Second RN helped to get pt back in bed. EDP made aware.
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
## 2021-02-28 NOTE — ED Notes (Signed)
Pt to CT with Nira Conn RN

## 2021-02-28 NOTE — ED Notes (Signed)
Pt alert, confused.  Family with pt

## 2021-02-28 NOTE — ED Notes (Signed)
Pt up to bathroom with assistance.  Family at bedside.  Pt alert, confused.

## 2021-02-28 NOTE — ED Notes (Signed)
Pt unaware of who wife is. EDP made aware.

## 2021-02-28 NOTE — ED Triage Notes (Signed)
Pt to ED right sided facial droop.difficulty getting words out. Wife reports numbness and tingling in left arm.  LKW 1400. Witnessed decline by wife.  Hx TIA
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
## 2021-02-28 NOTE — ED Notes (Signed)
CODE  STROKE  CALLED  TO  CARELINK  

## 2021-02-28 NOTE — Consult Note (Signed)
Neurology Consultation Reason for Consult: Aphasia Referring Physician: Wonda Horner  CC: Episode of aphasia  History is obtained from: Patient, wife  HPI: Peter Richard is a 70 y.o. 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  He states that when he was younger, he got some relatively frequently and they were considered to be migraines.  Today, he had some tingling of his left hand that then spread up his arm and then he had a period where he was having trouble talking.  His wife states that he was essentially noncommunicative at all and she brought him into the emergency department.  He has been improving since that time, but on arrival he still had difficulty speaking and therefore code stroke was activated.   LKW: 2 PM tpa given?: no, mild symptoms   ROS: A 14 point ROS was performed and is negative except as noted in the HPI.  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 His smoking use included cigarettes. He has never used smokeless tobacco. He reports current alcohol use. He reports that he does not use drugs.   Exam: Current vital signs: BP 135/72   Pulse 66   Temp (!) 97.1 F (36.2 C) (Oral)   Resp 17   Ht '5\' 10"'$  (1.778 m)   Wt 78 kg   SpO2 98%   BMI 24.68 kg/m  Vital signs in last 24 hours: Temp:  [97.1 F (36.2 C)]  97.1 F (36.2 C) (08/31 1446) Pulse Rate:  [66-80] 66 (08/31 1530) Resp:  [16-18] 17 (08/31 1530) BP: (135-144)/(71-80) 135/72 (08/31 1530) SpO2:  [98 %] 98 % (08/31 1530) Weight:  [78 kg] 78 kg (08/31 1535)   Physical Exam  Constitutional: Appears well-developed and well-nourished.  Psych: Affect appropriate to situation Eyes: No scleral injection HENT: No OP obstruction MSK: no joint deformities.  Cardiovascular: Normal rate and regular rhythm.  Respiratory: Effort normal, non-labored breathing GI: Soft.  No distension. There is no tenderness.  Skin: WDI  Neuro: Mental Status: Patient is awake, alert, oriented to person, place, month, year, and situation. Patient is able to give a clear and coherent history. No signs of neglect He has mildly increased latency of response, but he is able to name most of the objects (unable to get cactus or hammock) and able to describe the "cookie caper" NIHSS picture Cranial Nerves: II: Visual Fields are full. Pupils are equal, round, and reactive to light.   III,IV, VI: EOMI without ptosis or diploplia.  V: Facial sensation is symmetric to temperature VII: Facial movement is symmetric.  VIII: hearing is intact to voice X: Uvula elevates symmetrically XI: Shoulder shrug is symmetric. XII: tongue is midline  without atrophy or fasciculations.  Motor: Tone is normal. Bulk is normal. 5/5 strength was present in all four extremities.  Sensory: Sensation is symmetric to light touch and temperature in the arms and legs. Cerebellar: No ataxia on finger-nose-finger     I have reviewed labs in epic and the results pertinent to this consultation are: Glucose 119  I have reviewed the images obtained: CT head-unremarkable, MRI brain from 8/29 performed for similar episodes with no acute finding but significant white matter disease.  MRA head and neck performed for similar episodes in January 2021 without significant vascular disease  He also  had MRIs from 2014 performed at [facility] as well.  Impression: 70 year old male with recurrent episodes of aphasia and tingling which have been longstanding over multiple decades since the patient's 20s.  Given that he has had significant work-up of these episodes including MRI only a few days ago as well as MR angiogram, I do not think that repeating further testing is at all likely to be beneficial.  He has been told in the past that these were likely migraine related, and I do think that it is possible.  The other possibility for recurrent stereotyped episodes such as this would be complex partial seizure, but the lack of it ever generalizing would argue against this.  Depakote would be one agent which could be useful in treating either condition and therefore I think it would make sense to give this an empiric trial to see if it helps.  Recommendations: 1) Depakote 500 mg twice daily 2) follow-up as an outpatient with Dr. Melrose Nakayama.  EEG, this could be done as an outpatient 3) I think further testing would be unlikely to yield further answers at this time given that it has been worked up multiple times in the past.   Roland Rack, MD [facility] [phone]  If 7pm- 7am, please page neurology on call as listed in [place].

## 2021-02-28 NOTE — ED Notes (Signed)
Family at bedside. 

## 2021-02-28 NOTE — ED Notes (Signed)
Code  stroke  cnl per  DR. Leonel Ramsay md  called  [facility]

## 2021-03-01 DIAGNOSIS — R569 Unspecified convulsions: Secondary | ICD-10-CM | POA: Diagnosis not present

## 2021-03-01 LAB — RETICULOCYTES
Immature Retic Fract: 5.2 % (ref 2.3–15.9)
RBC.: 3.38 MIL/uL — ABNORMAL LOW (ref 4.22–5.81)
Retic Count, Absolute: 42.3 10*3/uL (ref 19.0–186.0)
Retic Ct Pct: 1.3 % (ref 0.4–3.1)

## 2021-03-01 LAB — CBC
HCT: 27.8 % — ABNORMAL LOW (ref 39.0–52.0)
Hemoglobin: 9.8 g/dL — ABNORMAL LOW (ref 13.0–17.0)
MCH: 31.9 pg (ref 26.0–34.0)
MCHC: 35.3 g/dL (ref 30.0–36.0)
MCV: 90.6 fL (ref 80.0–100.0)
Platelets: 211 10*3/uL (ref 150–400)
RBC: 3.07 MIL/uL — ABNORMAL LOW (ref 4.22–5.81)
RDW: 13.2 % (ref 11.5–15.5)
WBC: 5.6 10*3/uL (ref 4.0–10.5)
nRBC: 0 % (ref 0.0–0.2)

## 2021-03-01 LAB — COMPREHENSIVE METABOLIC PANEL
ALT: 8 U/L (ref 0–44)
AST: 15 U/L (ref 15–41)
Albumin: 2.4 g/dL — ABNORMAL LOW (ref 3.5–5.0)
Alkaline Phosphatase: 39 U/L (ref 38–126)
Anion gap: 4 — ABNORMAL LOW (ref 5–15)
BUN: 26 mg/dL — ABNORMAL HIGH (ref 8–23)
CO2: 29 mmol/L (ref 22–32)
Calcium: 8.6 mg/dL — ABNORMAL LOW (ref 8.9–10.3)
Chloride: 108 mmol/L (ref 98–111)
Creatinine, Ser: 2.04 mg/dL — ABNORMAL HIGH (ref 0.61–1.24)
GFR, Estimated: 34 mL/min — ABNORMAL LOW (ref >60)
Glucose, Bld: 98 mg/dL (ref 70–99)
Potassium: 4.4 mmol/L (ref 3.5–5.1)
Sodium: 141 mmol/L (ref 135–145)
Total Bilirubin: 0.8 mg/dL (ref 0.3–1.2)
Total Protein: 4.2 g/dL — ABNORMAL LOW (ref 6.5–8.1)

## 2021-03-01 LAB — IRON AND TIBC
Iron: 107 ug/dL (ref 45–182)
Saturation Ratios: 43 % — ABNORMAL HIGH (ref 17.9–39.5)
TIBC: 252 ug/dL (ref 250–450)
UIBC: 145 ug/dL

## 2021-03-01 LAB — FERRITIN: Ferritin: 185 ng/mL (ref 24–336)

## 2021-03-01 LAB — VITAMIN B12: Vitamin B-12: 397 pg/mL (ref 180–914)

## 2021-03-01 LAB — FOLATE: Folate: 23 ng/mL (ref >5.9)

## 2021-03-01 MED ORDER — VALPROIC ACID 250 MG/5ML PO SOLN
500.0000 mg | Freq: Two times a day (BID) | ORAL | Status: DC
  Administered 2021-03-01 (×2): 500 mg via ORAL
  Filled 2021-03-01 (×3): qty 10

## 2021-03-01 MED ORDER — VALPROIC ACID 250 MG/5ML PO SOLN: 500.0000 mg | Freq: Two times a day (BID) | ORAL | 2 refills | Status: DC

## 2021-03-01 MED ORDER — ACETAMINOPHEN 325 MG PO TABS
650.0000 mg | ORAL_TABLET | Freq: Once | ORAL | Status: AC
  Administered 2021-03-01: 650 mg via ORAL

## 2021-03-01 MED ORDER — SODIUM CHLORIDE 0.9 % IV SOLN: INTRAVENOUS | Status: DC

## 2021-03-01 MED ORDER — ALLOPURINOL 100 MG PO TABS
200.0000 mg | ORAL_TABLET | Freq: Every day | ORAL | Status: DC
  Administered 2021-03-01: 200 mg via ORAL
  Filled 2021-03-01: qty 2

## 2021-03-01 MED ORDER — DIVALPROEX SODIUM 500 MG PO DR TAB: 500.0000 mg | DELAYED_RELEASE_TABLET | Freq: Two times a day (BID) | ORAL | 2 refills | Status: AC

## 2021-03-01 MED ORDER — DIVALPROEX SODIUM 500 MG PO DR TAB: 500.0000 mg | DELAYED_RELEASE_TABLET | Freq: Two times a day (BID) | ORAL | Status: DC

## 2021-03-01 NOTE — ED Notes (Signed)
Provider at bedside

## 2021-03-01 NOTE — ED Notes (Signed)
Wife upset with visiting policies, as two visitors per day are allowed. Daughter is currently at bedside. Son is planning to visit. Pt states that she saw on the website that this is not the case- visiting policy brought up on computer and shown to wife. Wife states that she will be coming back, which this RN said was fine. However, son will not be able to visit today, encouraged visitation tomorrow for him- wife states pt will be discharged today.

## 2021-03-01 NOTE — ED Notes (Signed)
Pt provided breakfast tray.

## 2021-03-01 NOTE — Procedures (Signed)
History: 69 yo M With transient episodes of aphasai.   Sedation: None AEDs: depakote  Technique: This EEG was acquired with electrodes placed according to the International 10-20 electrode system (including Fp1, Fp2, F3, F4, C3, C4, P3, P4, O1, O2, T3, T4, T5, T6, A1, A2, Fz, Cz, Pz). The following electrodes were missing or displaced: none.   Background: The background consists of intermixed alpha and beta activities. There is a well defined posterior dominant rhythm of 8-9 Hz that attenuates with eye opening. Sleep is not recorded, btu there si anterior shifting of the PDR and increased slow activity with drowsiness.   Photic stimulation: Physiologic driving is present  EEG Abnormalities: None  Clinical Interpretation: This normal EEG is recorded in the waking and drowsy state. There was no seizure or seizure predisposition recorded on this study. Please note that lack of epileptiform activity on EEG does not preclude the possibility of epilepsy.   Roland Rack, MD [facility] [phone]  If 7pm- 7am, please page neurology on call as listed in [place].

## 2021-03-01 NOTE — ED Notes (Signed)
Pt will be d/c p EEG

## 2021-03-01 NOTE — Progress Notes (Signed)
Subjective: Back to baseline. Had another episode lasting for hours after I saw him yesterday. Improved after depacon load.   Exam: Vitals:   03/01/21 0802 03/01/21 0922  BP:  134/75  Pulse:  65  Resp:  18  Temp: 98 F (36.7 C)   SpO2:  96%   Gen: In bed, NAD Resp: non-labored breathing, no acute distress Abd: soft, nt  Neuro: MS: awake, alert, gives year as 2002. Knows month is september [id number], VFF Motor: 5/5 througout Sensory:intact to LT   Pertinent Labs: Cr 2.04  Impression: 70 yo M with  recurrent episodes of aphasia and tingling which have been longstanding over multiple decades since the patient's 61s. I think that seizures are a significant possibility, but migraine is as well.   Recommendations: 1) EEG 2) Depakote '500mg'$  BID.  3) Outpatient neurology follow up.   Roland Rack, MD [facility] [phone]  If 7pm- 7am, please page neurology on call as listed in [place].

## 2021-03-01 NOTE — Discharge Summary (Signed)
Physician Discharge Summary   Peter Richard  male DOB: 1950/10/11  [id number]  PCP: Sofie Hartigan, MD  Admit date: 02/28/2021 Discharge date: 03/01/2021  Admitted From: home Disposition:  home Daughter and son updated at bedside prior to discharge.  CODE STATUS: Full code   Hospital Course:  For full details, please see H&P, progress notes, consult notes and ancillary notes.  Briefly,  Peter Richard is a 70 y.o. 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   He is currently followed at [facility] and per wife was diagnosed with recurrent tia and a trial of plavix was added to his regimen. 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  She notes has time goes on patient episodes are more frequent and prolonged. She states last few episodes have been around 5 hours until patient is back to his baseline.  She notes that he has an episode last weekend and had an outpatient MRI 3 days ago. 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   On the day of presentation, pt had recurrent episode that initially began with tingling of his left hand that then spread up his arm and there after progresses to inability with his speech, stated it was as if he was speaking another language.  Per dtr patient father has similar episodes and patient has two grandchildren that have been diagnosed with sz.    Recurrent episodes of aphasia  --per neuro, may be due to complicated migraine or seizures. --s/p valproic acid load  --spot EEG wnl --pt discharged on Depakote '500mg'$  BID.  --pt will follow up with his outpatient neurologist Dr. Melrose Nakayama.   Anemia  --anemia workup  unremarkable   CKDIV Can not diagnose AKI --Cr 2.01 on presentation, and was 2.04 the next day.  Recent Cr has been around 2 or higher.   CVA --cont plavix.   --no longer takes ASA   GERD -ppi   HLD -continue statin     HTN --cont home amlodipine, lasix, and valsartan   Discharge Diagnoses:  Active Problems:   Seizure [facility])    Discharge Instructions:  Allergies as of 03/01/2021       Reactions   Codeine Other (See Comments)   Makes Patient violently ill.   Lisinopril Other (See Comments)   Other reaction(s): Dizziness   Doxycycline Diarrhea, Nausea And Vomiting, Nausea Only   Other reaction(s): Dizziness        Medication List     STOP taking these medications    aspirin 81 MG EC tablet   GenTeal Tears 0.1-0.2-0.3 % Soln   HYDROcodone-acetaminophen 5-325 MG tablet Commonly known as: Norco       TAKE these medications    acetaminophen 500 MG tablet Commonly known as: TYLENOL Take 1,000 mg by mouth every 8 (eight) hours as needed for moderate pain.   allopurinol 100 MG tablet Commonly known as: ZYLOPRIM Take 200 mg by mouth daily.   amLODipine 5 MG tablet Commonly known as: NORVASC Take 5 mg by mouth daily.   ascorbic acid 1000 MG tablet Commonly known as: VITAMIN C Take 1 mg by mouth daily.   atorvastatin 40 MG tablet Commonly known as: LIPITOR Take 40 mg by mouth at bedtime.   calcium  carbonate 500 MG chewable tablet Commonly known as: TUMS - dosed in mg elemental calcium Chew 1 tablet by mouth daily as needed for indigestion or heartburn.   cetirizine 10 MG tablet Commonly known as: ZYRTEC Take 10 mg by mouth daily as needed for allergies.   Cholecalciferol 125 MCG (5000 UT) capsule Take 5,000 Units by mouth daily.   clopidogrel 75 MG tablet Commonly known as: PLAVIX Take 75 mg by mouth daily.   diclofenac Sodium 1 % Gel Commonly known as: VOLTAREN Apply 1 application topically 4 (four) times daily as needed (pain).    divalproex 500 MG DR tablet Commonly known as: DEPAKOTE Take 1 tablet (500 mg total) by mouth every 12 (twelve) hours.   famotidine 20 MG tablet Commonly known as: PEPCID Take 20 mg by mouth daily.   fluticasone 50 MCG/ACT nasal spray Commonly known as: FLONASE Place 1 spray into both nostrils daily as needed for allergies or rhinitis.   furosemide 40 MG tablet Commonly known as: LASIX Take 40 mg by mouth 2 (two) times daily.   GLUCOSAMINE CHOND COMPLEX/MSM PO Take 1 tablet by mouth daily as needed (joint health).   Melatonin 10 MG Tabs Take 20-30 mg by mouth at bedtime as needed (sleep).   multivitamin with minerals tablet Take 1 tablet by mouth daily.   Omega-3 1000 MG Caps Take 1,000 mg by mouth daily.   tadalafil 20 MG tablet Commonly known as: CIALIS Take 20 mg by mouth daily as needed for erectile dysfunction.   valsartan 80 MG tablet Commonly known as: DIOVAN Take 80 mg by mouth daily. What changed: Another medication with the same name was removed. Continue taking this medication, and follow the directions you see here.         Follow-up Information     Anabel Bene, MD Follow up in 1 week(s).   Specialty: Neurology Contact information: [facility] [place] [address] [phone]         Sofie Hartigan, MD Follow up in 1 week(s).   Specialty: Family Medicine Contact information: [address] [place] [address] [phone]                 Allergies  Allergen Reactions   Codeine Other (See Comments)    Makes Patient violently ill.   Lisinopril Other (See Comments)    Other reaction(s): Dizziness   Doxycycline Diarrhea, Nausea And Vomiting and Nausea Only    Other reaction(s): Dizziness     The results of significant diagnostics from this hospitalization (including imaging, microbiology, ancillary and laboratory) are listed below for reference.    Consultations:   Procedures/Studies: MR BRAIN WO CONTRAST  Result Date: 02/26/2021 CLINICAL DATA:  70 year old male with episodes of altered mental status for 2 weeks. Difficulty moving upper extremities. EXAM: MRI HEAD WITHOUT CONTRAST TECHNIQUE: Multiplanar, multiecho pulse sequences of the brain and surrounding structures were obtained without intravenous contrast. COMPARISON:  Brain MRI 12/02/2018, head and neck MRA 07/07/2019. FINDINGS: Brain: No restricted diffusion to suggest acute infarction. No midline shift, mass effect, evidence of mass lesion, ventriculomegaly, extra-axial collection or acute intracranial hemorrhage. Cervicomedullary junction and pituitary are within normal limits. Chronically advanced, confluent bilateral cerebral white matter T2 and FLAIR hyperintensity is in a nonspecific configuration, and not significantly changed since 2020. There are scattered small areas of cystic white matter encephalomalacia. Bilateral deep white matter capsule involvement. Prominent bilateral anterior temporal lobe involvement. No cortical encephalomalacia. But there are a  few scattered chronic microhemorrhages in the brain including both cerebral hemispheres, cerebellum. Chronic T2 heterogeneity in the bilateral basal ganglia and left thalamus is stable. There is a chronic lacunar infarct of the dorsal left brainstem. Vascular: Major intracranial vascular flow voids are stable since 2020, with generalized intracranial artery tortuosity. Skull and upper cervical spine: Negative visible cervical spine. Normal bone marrow signal. Sinuses/Orbits: Negative.  Paranasal sinuses remain well aerated. Other: Mild right mastoid effusion is new since 2020. Negative visible nasopharynx. Left mastoids remain clear. Grossly normal other internal auditory structures. Negative visible scalp and face. IMPRESSION: 1. No acute intracranial abnormality. 2. Chronically advanced white matter disease with occasional  chronic microhemorrhages and deep gray matter, brainstem lacunar infarcts. 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 3. Mild right mastoid effusion is new since 2020 but likely postinflammatory and significance is doubtful. Electronically Signed   By: Genevie Ann M.D.   On: 02/26/2021 08:29   Portable chest 1 View  Result Date: 02/28/2021 CLINICAL DATA:  Right-sided facial droop. EXAM: PORTABLE CHEST 1 VIEW COMPARISON:  Chest radiograph dated 12/16/2017. FINDINGS: No focal consolidation, pleural effusion or pneumothorax. The cardiac silhouette is within limits. No acute osseous pathology. IMPRESSION: No active disease. Electronically Signed   By: Anner Crete M.D.   On: 02/28/2021 23:29   CT HEAD CODE STROKE WO CONTRAST  Result Date: 02/28/2021 CLINICAL DATA:  Code stroke.  Left-sided weakness and numbness EXAM: CT HEAD WITHOUT CONTRAST TECHNIQUE: Contiguous axial images were obtained from the base of the skull through the vertex without intravenous contrast. COMPARISON:  Correlation made with prior MRI 02/24/2021 FINDINGS: Brain: There is no acute intracranial hemorrhage, mass effect, or edema. No acute appearing loss of gray-white differentiation. Confluent areas of hypoattenuation in the supratentorial white matter are nonspecific but probably reflect advanced chronic microvascular ischemic changes. There are chronic small vessel infarcts of the right centrum semiovale and left pons. Prominence of the ventricles and sulci reflects parenchymal volume loss. No extra-axial collection. Vascular: No hyperdense vessel. Skull: Unremarkable. Sinuses/Orbits: No acute abnormality. Other: Mastoid air cells are clear. ASPECTS ([place] Stroke Program Early CT Score) - Ganglionic level infarction (caudate, lentiform nuclei, internal capsule, insula, M1-M3 cortex): 7 - Supraganglionic infarction (M4-M6 cortex): 3 Total score (0-10 with 10 being  normal): 10 IMPRESSION: There is no acute intracranial hemorrhage or evidence of acute infarction. ASPECT score is 10. These results were communicated to Dr. Leonel Ramsay at 2:58 pm on 02/28/2021 by text page via the [facility] messaging system. Electronically Signed   By: Macy Mis M.D.   On: 02/28/2021 15:01      Labs: BNP (last 3 results) No results for input(s): BNP in the last 8760 hours. Basic Metabolic Panel: Recent Labs  Lab 02/28/21 1514 03/01/21 0624  NA 140 141  K 3.9 4.4  CL 104 108  CO2 30 29  GLUCOSE 119* 98  BUN 29* 26*  CREATININE 2.01* 2.04*  CALCIUM 9.0 8.6*   Liver Function Tests: Recent Labs  Lab 02/28/21 1514 03/01/21 0624  AST 18 15  ALT 10 8  ALKPHOS 46 39  BILITOT 0.5 0.8  PROT 5.1* 4.2*  ALBUMIN 2.8* 2.4*   No results for input(s): LIPASE, AMYLASE in the last 168 hours. No results for input(s): AMMONIA in the last 168 hours. CBC: Recent Labs  Lab 02/28/21 1514 03/01/21 0624  WBC 5.3 5.6  NEUTROABS 3.4  --   HGB 10.8* 9.8*  HCT 30.5*  27.8*  MCV 90.0 90.6  PLT 241 211   Cardiac Enzymes: No results for input(s): CKTOTAL, CKMB, CKMBINDEX, TROPONINI in the last 168 hours. BNP: Invalid input(s): POCBNP CBG: No results for input(s): GLUCAP in the last 168 hours. D-Dimer No results for input(s): DDIMER in the last 72 hours. Hgb A1c No results for input(s): HGBA1C in the last 72 hours. Lipid Profile No results for input(s): CHOL, HDL, LDLCALC, TRIG, CHOLHDL, LDLDIRECT in the last 72 hours. Thyroid function studies Recent Labs    02/28/21 1514  TSH 4.106   Anemia work up Recent Labs    02/28/21 1514  FOLATE 23.0  FERRITIN 185  TIBC 252  IRON 107  RETICCTPCT 1.3   Urinalysis    Component Value Date/Time   APPEARANCEUR Hazy (A) 09/20/2020 1457   GLUCOSEU Negative 09/20/2020 1457   BILIRUBINUR Negative 09/20/2020 1457   PROTEINUR 3+ (A) 09/20/2020 1457   NITRITE Negative 09/20/2020 1457   LEUKOCYTESUR Negative 09/20/2020  1457   Sepsis Labs Invalid input(s): PROCALCITONIN,  WBC,  LACTICIDVEN Microbiology Recent Results (from the past 240 hour(s))  Resp Panel by RT-PCR (Flu A&B, Covid) Nasopharyngeal Swab     Status: None   Collection Time: 02/28/21 10:23 PM   Specimen: Nasopharyngeal Swab; Nasopharyngeal(NP) swabs in vial transport medium  Result Value Ref Range Status   SARS Coronavirus 2 by RT PCR NEGATIVE NEGATIVE Final    Comment: (NOTE) SARS-CoV-2 target nucleic acids are NOT DETECTED.  The SARS-CoV-2 RNA is generally detectable in upper respiratory specimens during the acute phase of infection. The lowest concentration of SARS-CoV-2 viral copies this assay can detect is 138 copies/mL. A negative result does not preclude SARS-Cov-2 infection and should not be used as the sole basis for treatment or other patient management decisions. A negative result may occur with  improper specimen collection/handling, submission of specimen other than nasopharyngeal swab, presence of viral mutation(s) within the areas targeted by this assay, and inadequate number of viral copies(<138 copies/mL). A negative result must be combined with clinical observations, patient history, and epidemiological information. The expected result is Negative.  Fact Sheet for Patients:  EntrepreneurPulse.com.au  Fact Sheet for Healthcare Providers:  IncredibleEmployment.be  This test is no t yet approved or cleared by the [place] FDA and  has been authorized for detection and/or diagnosis of SARS-CoV-2 by FDA under an Emergency Use Authorization (EUA). This EUA will remain  in effect (meaning this test can be used) for the duration of the COVID-19 declaration under Section 564(b)(1) of the Act, 21 U.S.C.section 360bbb-3(b)(1), unless the authorization is terminated  or revoked sooner.       Influenza A by PCR NEGATIVE NEGATIVE Final   Influenza B by PCR NEGATIVE NEGATIVE Final     Comment: (NOTE) The Xpert Xpress SARS-CoV-2/FLU/RSV plus assay is intended as an aid in the diagnosis of influenza from Nasopharyngeal swab specimens and should not be used as a sole basis for treatment. Nasal washings and aspirates are unacceptable for Xpert Xpress SARS-CoV-2/FLU/RSV testing.  Fact Sheet for Patients: EntrepreneurPulse.com.au  Fact Sheet for Healthcare Providers: IncredibleEmployment.be  This test is not yet approved or cleared by the [place] FDA and has been authorized for detection and/or diagnosis of SARS-CoV-2 by FDA under an Emergency Use Authorization (EUA). This EUA will remain in effect (meaning this test can be used) for the duration of the COVID-19 declaration under Section 564(b)(1) of the Act, 21 U.S.C. section 360bbb-3(b)(1), unless the authorization is terminated or revoked.  Performed  at [facility], [address] [place], [place] [address]      Total time spend on discharging this patient, including the last patient exam, discussing the hospital stay, instructions for ongoing care as it relates to all pertinent caregivers, as well as preparing the medical discharge records, prescriptions, and/or referrals as applicable, is 60 minutes.    Enzo Bi, MD  [facility] 03/01/2021, 10:40 AM

## 2021-03-01 NOTE — ED Notes (Signed)
Messaged Dr to see if we needed to wait for EEG results. Pt and family very anxious to leave.

## 2021-03-01 NOTE — ED Notes (Addendum)
Pt resting comfortably in bed. Pt alert and oriented x 4 at this time, although hard of hearing. Pt had half of smoothie this morning.

## 2021-03-01 NOTE — ED Notes (Signed)
Pt asleep in bed at this time, connected to cardiac monitor. Family at bedside

## 2021-03-01 NOTE — Progress Notes (Signed)
Eeg done 

## 2021-03-01 NOTE — ED Notes (Signed)
Pt connected to cardiac monitor at this time, refusing BP and pulse ox at this time

## 2021-03-16 ENCOUNTER — Other Ambulatory Visit: Payer: Self-pay | Admitting: Gerontology

## 2021-03-16 ENCOUNTER — Other Ambulatory Visit (HOSPITAL_COMMUNITY): Payer: Self-pay | Admitting: Gerontology

## 2021-03-22 ENCOUNTER — Other Ambulatory Visit (HOSPITAL_BASED_OUTPATIENT_CLINIC_OR_DEPARTMENT_OTHER): Payer: Self-pay | Admitting: Gerontology

## 2021-03-22 ENCOUNTER — Other Ambulatory Visit: Payer: Self-pay | Admitting: Gerontology

## 2021-03-22 DIAGNOSIS — R634 Abnormal weight loss: Secondary | ICD-10-CM

## 2021-03-22 DIAGNOSIS — R0609 Other forms of dyspnea: Secondary | ICD-10-CM

## 2021-03-22 DIAGNOSIS — R0600 Dyspnea, unspecified: Secondary | ICD-10-CM

## 2021-03-22 DIAGNOSIS — R569 Unspecified convulsions: Secondary | ICD-10-CM

## 2021-04-11 ENCOUNTER — Other Ambulatory Visit: Payer: Self-pay

## 2021-04-11 ENCOUNTER — Ambulatory Visit
Admission: RE | Admit: 2021-04-11 | Discharge: 2021-04-11 | Disposition: A | Discharge status: Home / Self Care | Payer: Medicare Other | Source: Ambulatory Visit | Attending: Gerontology | Admitting: Gerontology

## 2021-04-11 DIAGNOSIS — R0609 Other forms of dyspnea: Secondary | ICD-10-CM | POA: Diagnosis present

## 2021-04-11 DIAGNOSIS — R634 Abnormal weight loss: Secondary | ICD-10-CM | POA: Insufficient documentation

## 2021-04-11 DIAGNOSIS — R569 Unspecified convulsions: Secondary | ICD-10-CM | POA: Diagnosis present

## 2021-04-11 MED ORDER — IOHEXOL 350 MG/ML SOLN
60.0000 mL | Freq: Once | INTRAVENOUS | Status: AC | PRN
  Administered 2021-04-11: 50 mL via INTRAVENOUS

## 2021-05-18 ENCOUNTER — Ambulatory Visit: Payer: Self-pay | Admitting: Urology

## 2022-04-29 ENCOUNTER — Encounter (INDEPENDENT_AMBULATORY_CARE_PROVIDER_SITE_OTHER): Payer: Self-pay

## 2022-07-06 IMAGING — US US EXTREM LOW VENOUS*R*
1 series · 14 of 24 positions shown · non-contrast
Comparison: None.

CLINICAL DATA: Rule out DVT.  Right calf pain.

EXAM:
RIGHT LOWER EXTREMITY VENOUS DOPPLER ULTRASOUND
TECHNIQUE: Gray-scale sonography with compression, as well as color and duplex
ultrasound, were performed to evaluate the deep venous system(s)
from the level of the common femoral vein through the popliteal and
proximal calf veins.

[Series 1: us venous img lower uni right (dvt) · portal-venous · 14 of 35 slices shown]
[im 1/35]
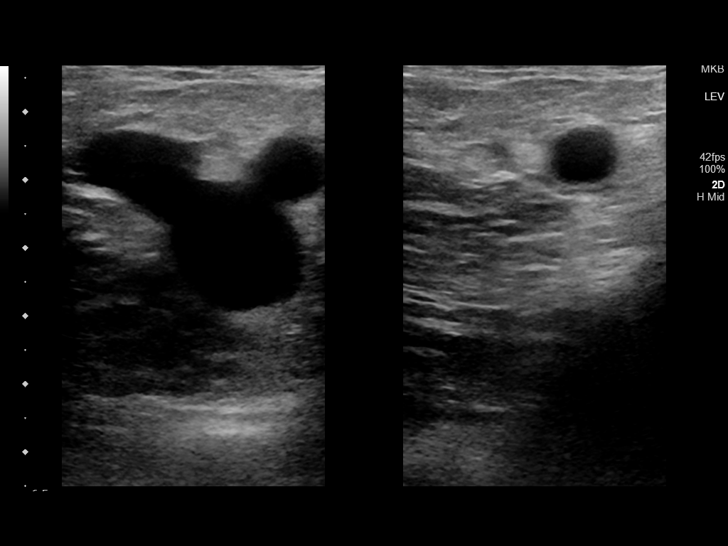
[im 3/35]
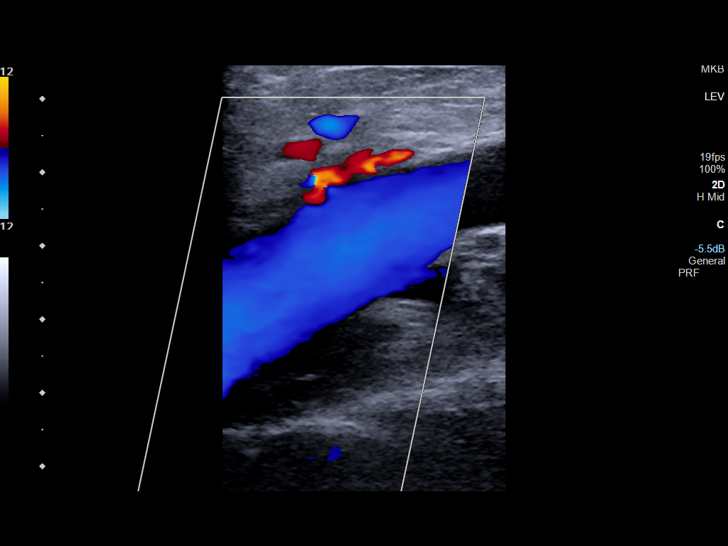
[im 6/35]
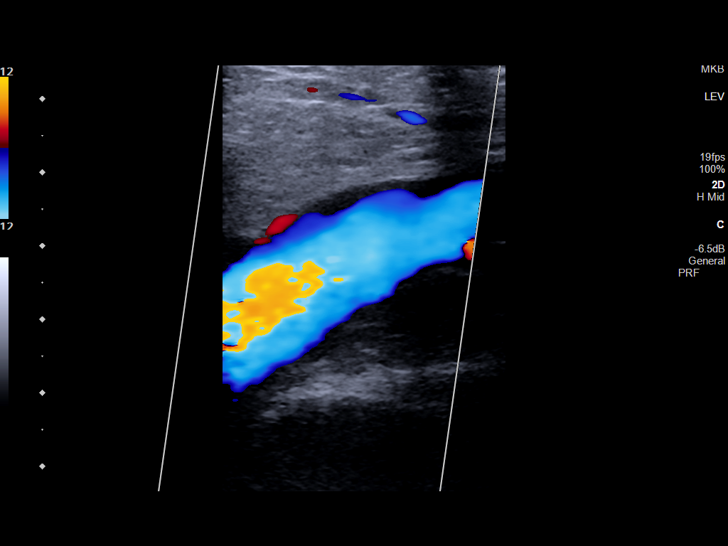
[im 9/35]
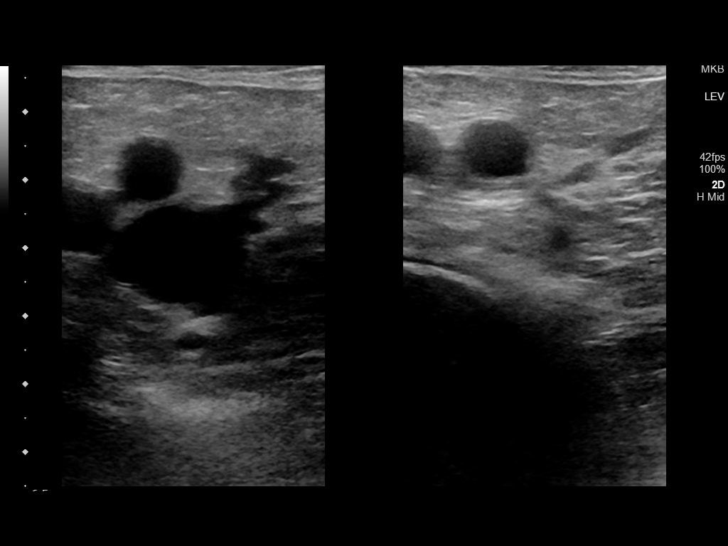
[im 11/35]
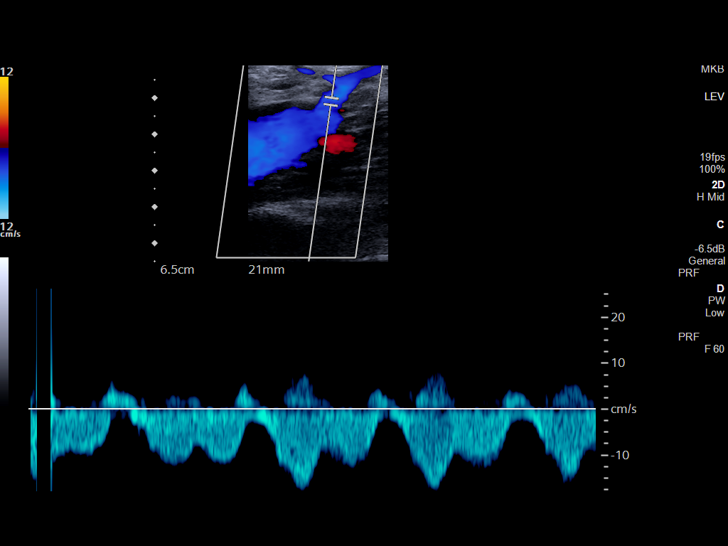
[im 14/35]
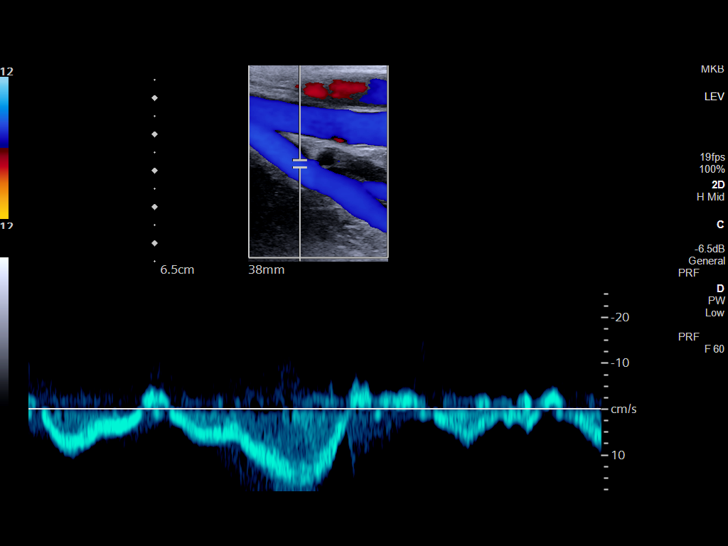
[im 17/35]
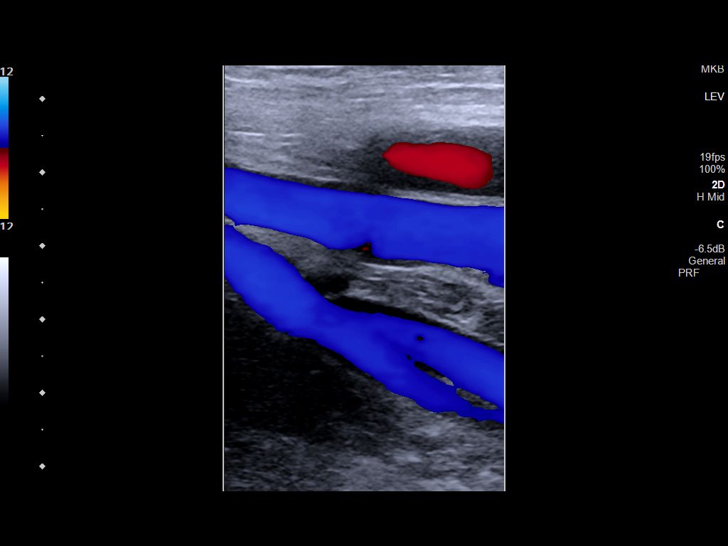
[im 18/35]
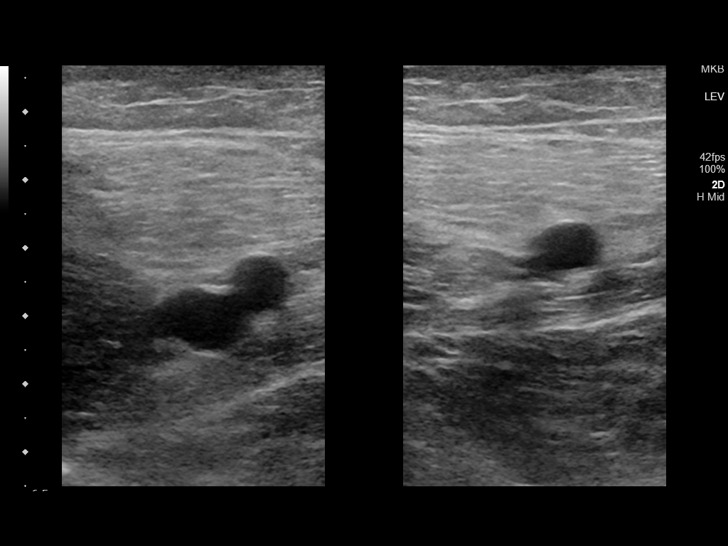
[im 21/35]
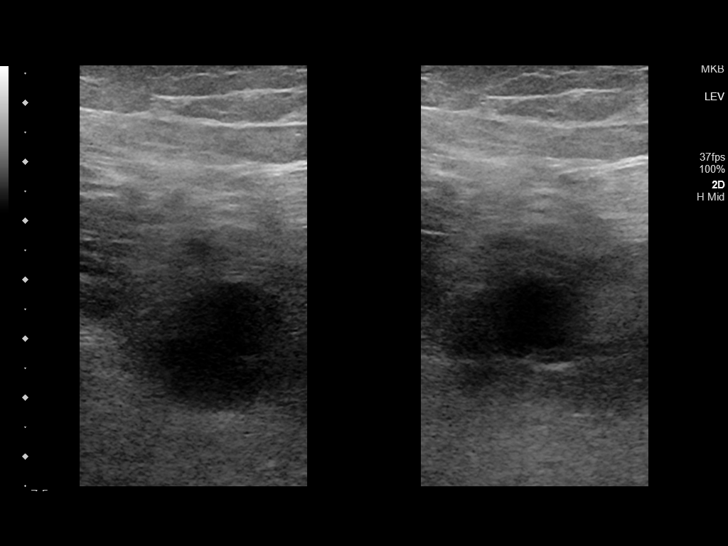
[im 24/35]
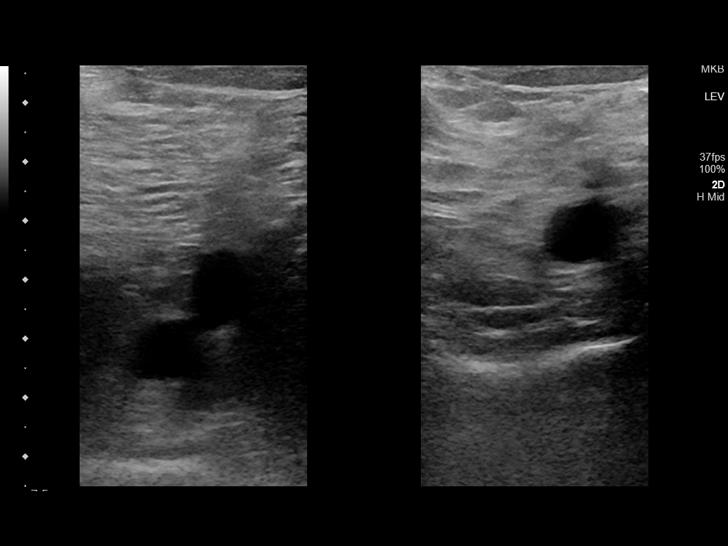
[im 27/35]
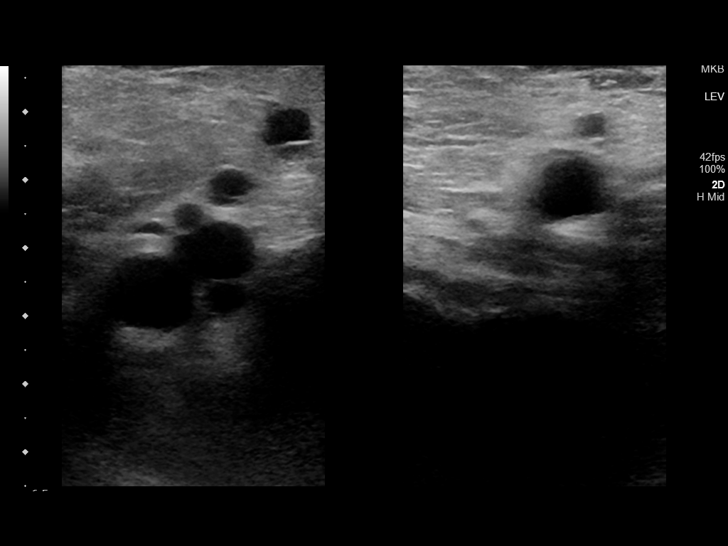
[im 29/35]
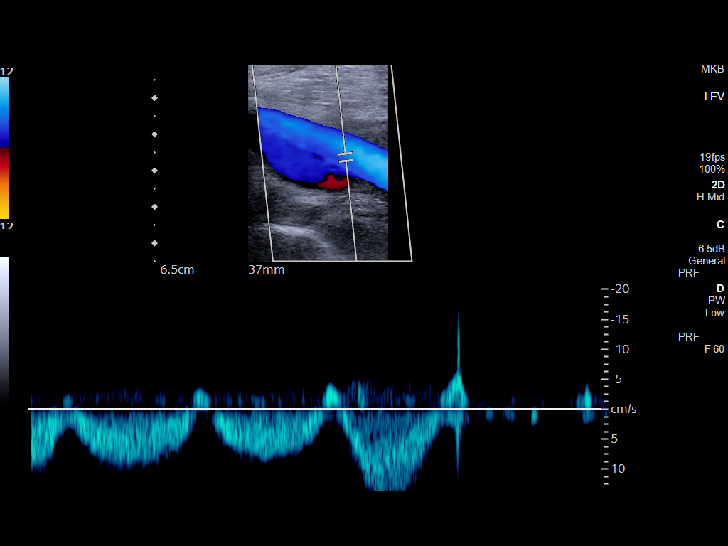
[im 32/35]
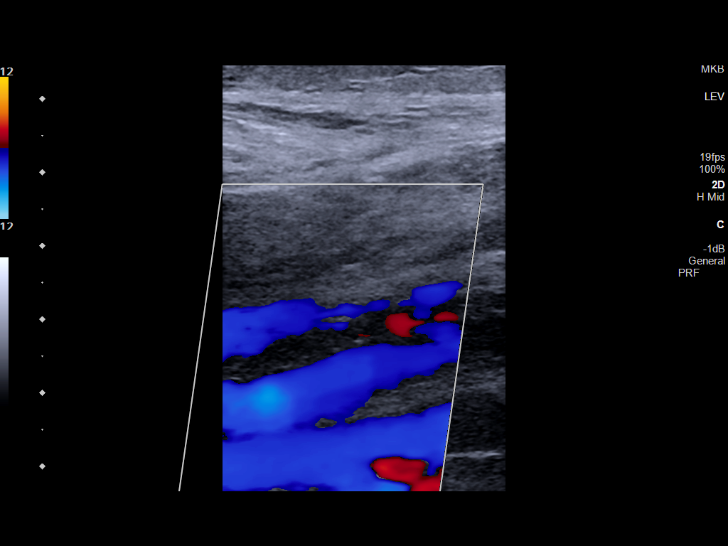
[im 35/35]
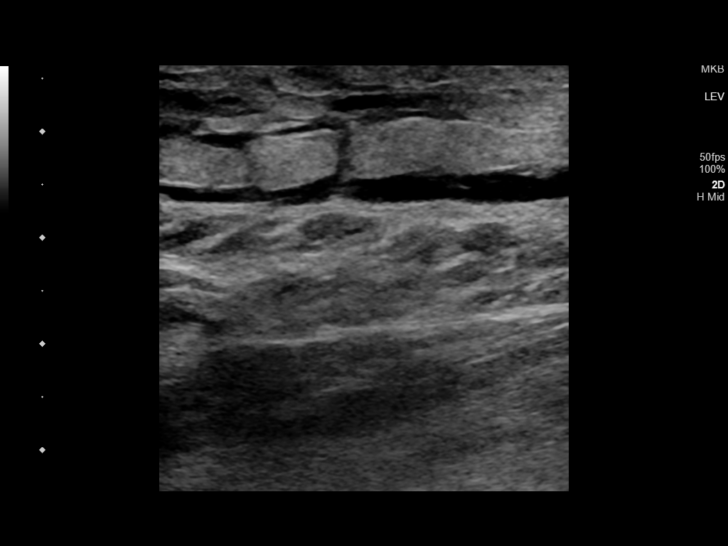

[14 of 24 positions shown; findings below may reference images not displayed]

FINDINGS: VENOUS

Normal compressibility of the common femoral, superficial femoral,
and popliteal veins, as well as the visualized calf veins.
Visualized portions of profunda femoral vein and great saphenous
vein unremarkable. No filling defects to suggest DVT on grayscale or
color Doppler imaging. Doppler waveforms show normal direction of
venous flow, normal respiratory plasticity and response to
augmentation.

Limited views of the contralateral common femoral vein are
unremarkable.

OTHER

Right calf edema.
IMPRESSION: 1. No evidence of DVT.
2. Right calf edema.

## 2022-08-26 IMAGING — MR MR KNEE*R* W/O CM
6 series · 40 of 40 positions shown · non-contrast
Comparison: None.

CLINICAL DATA: Several falls.  Right knee pain.

EXAM:
MRI OF THE RIGHT KNEE WITHOUT CONTRAST
TECHNIQUE: Multiplanar, multisequence MR imaging of the knee was performed. No
intravenous contrast was administered.

[Series 8: T2 fat-sat · axial · right · 4.0mm · 0.50mm/px · z∈[-78,+47]mm · 5 of 26 slices shown (1 of 3)]
[im 1/26]
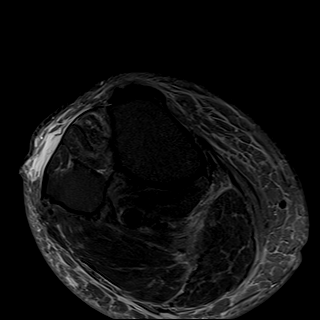
[im 7/26]
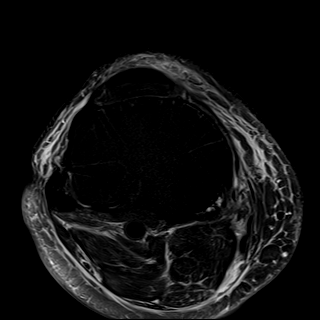
[im 13/26]
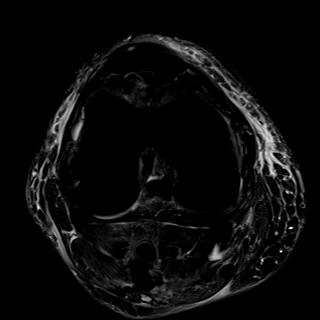
[im 19/26]
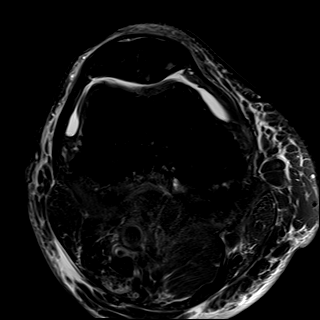
[im 26/26]
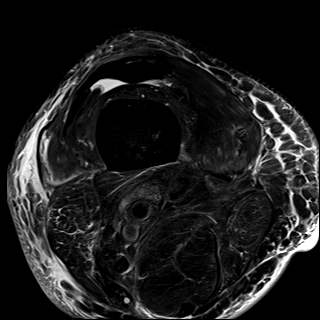

[Series 9: T2 fat-sat · coronal · right · 4.0mm · 0.59mm/px · 7 of 30 slices shown (2 of 3)]
[im 1/30]
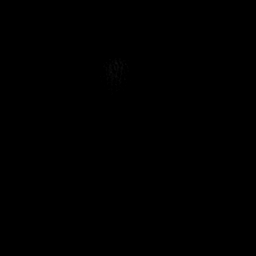
[im 5/30]
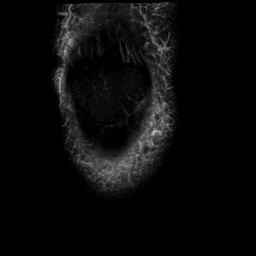
[im 10/30]
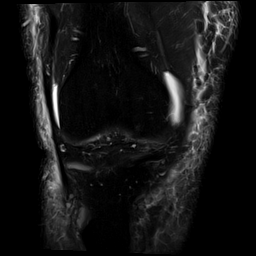
[im 15/30]
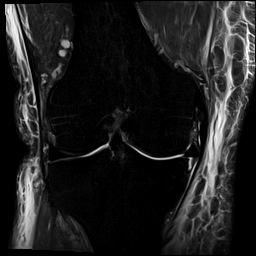
[im 20/30]
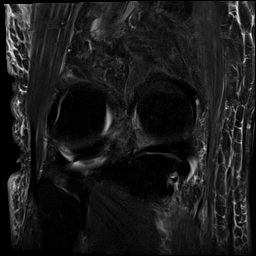
[im 25/30]
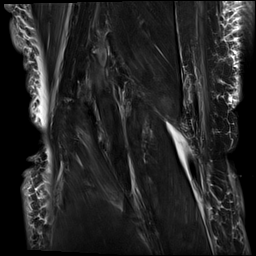
[im 30/30]
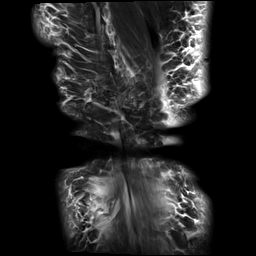

[Series 10: T1 · coronal · right · 4.0mm · 0.59mm/px · 7 of 30 slices shown]
[im 1/30]
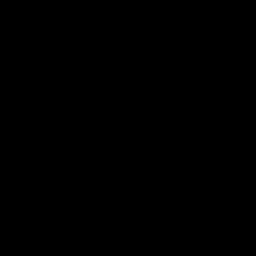
[im 5/30]
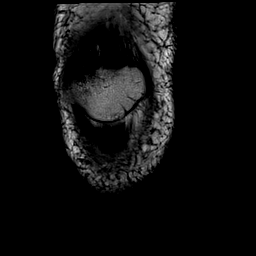
[im 10/30]
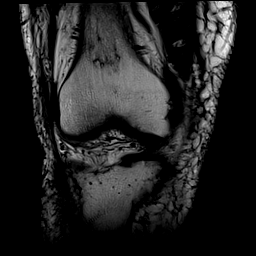
[im 15/30]
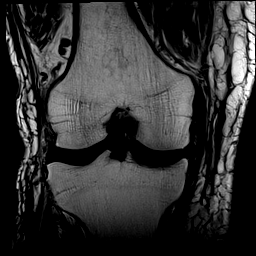
[im 20/30]
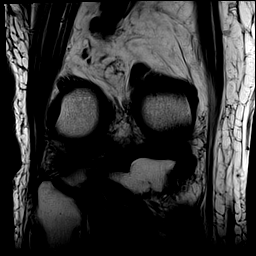
[im 25/30]
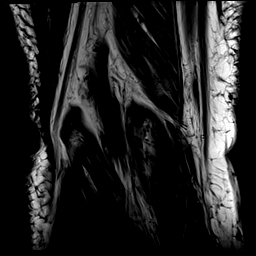
[im 30/30]
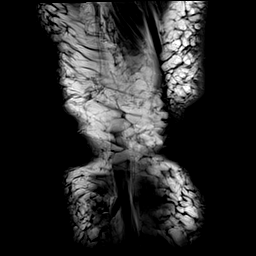

[Series 11: PD fat-sat · coronal · right · 4.0mm · 0.59mm/px · 7 of 30 slices shown (1 of 2)]
[im 1/30]
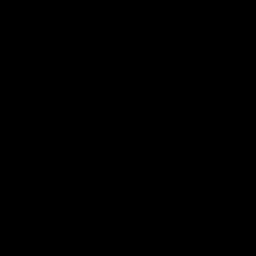
[im 5/30]
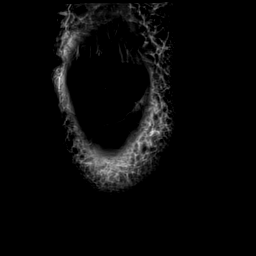
[im 10/30]
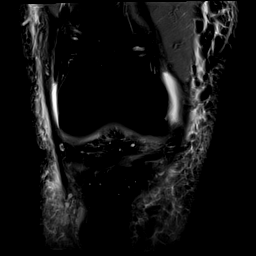
[im 15/30]
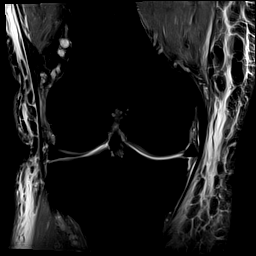
[im 20/30]
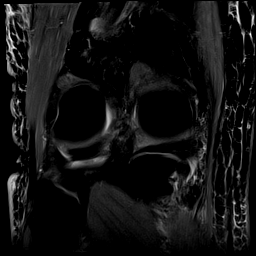
[im 25/30]
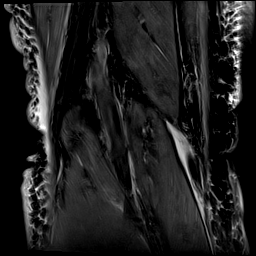
[im 30/30]
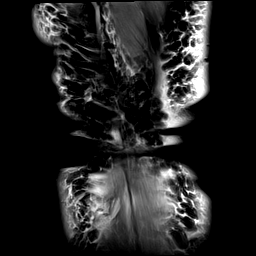

[Series 12: PD fat-sat · sagittal · right · 3.0mm · 0.59mm/px · 7 of 34 slices shown (2 of 2)]
[im 1/34]
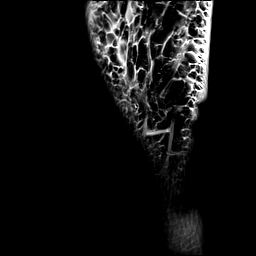
[im 6/34]
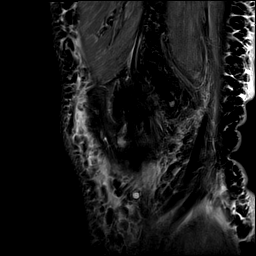
[im 12/34]
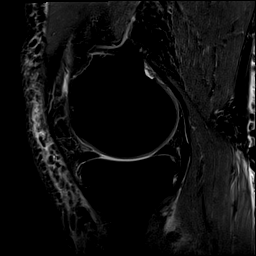
[im 17/34]
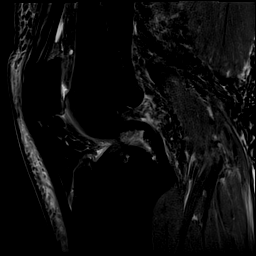
[im 23/34]
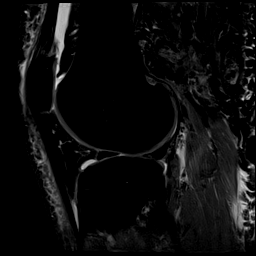
[im 28/34]
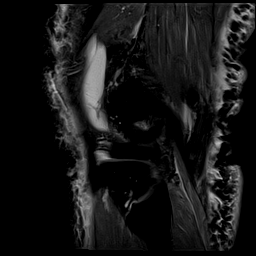
[im 34/34]
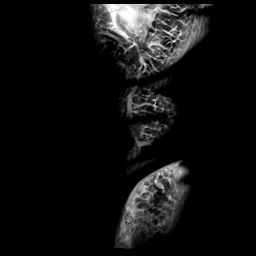

[Series 13: T2 fat-sat · sagittal · right · 3.0mm · 0.59mm/px · 7 of 34 slices shown (3 of 3)]
[im 1/34]
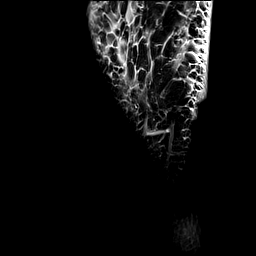
[im 6/34]
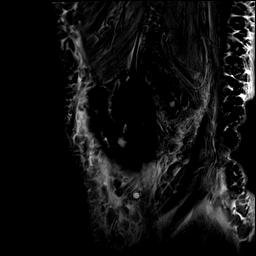
[im 12/34]
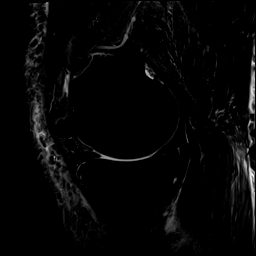
[im 17/34]
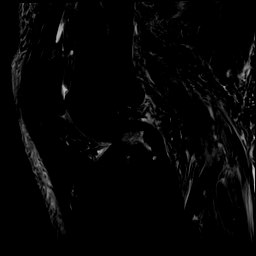
[im 23/34]
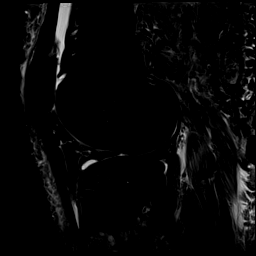
[im 28/34]
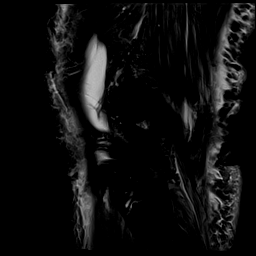
[im 34/34]
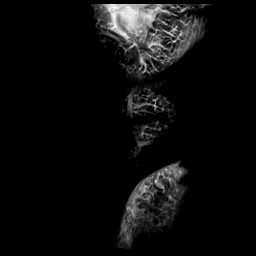

[40 of 40 positions shown; findings below may reference images not displayed]

FINDINGS: MENISCI

Medial: Complex tear of the posterior horn-body junction of the
medial meniscus with peripheral meniscal extrusion.

Lateral: Severe degeneration of the root of the posterior horn of
the lateral meniscus with possible partial radial tear. Tiny
undersurface tear at the free edge of the posterior horn of the
lateral meniscus.

LIGAMENTS

Cruciates: ACL and PCL are intact.

Collaterals: Medial collateral ligament is intact. Lateral
collateral ligament complex is intact.

CARTILAGE

Patellofemoral: Partial-thickness cartilage loss of the medial and
lateral patellar facets with areas of high-grade partial-thickness
cartilage loss and subchondral reactive marrow changes.

Medial: Mild partial-thickness cartilage loss of the medial
femorotibial compartment.

Lateral:  No chondral defect.

JOINT: No joint effusion. Normal Sherly Kaes Anzylia. No plical
thickening.

POPLITEAL FOSSA: Popliteus tendon is intact. Small Baker's cyst.
Small amount of fluid superficial to the gastrocnemius muscle likely
reflecting a leaking Baker's cyst.

EXTENSOR MECHANISM: Intact quadriceps tendon. Intact patellar
tendon. Intact lateral patellar retinaculum. Intact medial patellar
retinaculum. Intact MPFL.

BONES: No aggressive osseous lesion. No fracture or dislocation.

Other: No fluid collection or hematoma. Muscles are normal.
Nonspecific soft tissue edema in the subcutaneous fat
circumferentially around the knee.
IMPRESSION: 1. Complex tear of the posterior horn-body junction of the medial
meniscus with peripheral meniscal extrusion.
2. Severe degeneration of the root of the posterior horn of the
lateral meniscus with possible partial radial tear. Tiny
undersurface tear at the free edge of the posterior horn of the
lateral meniscus.
3. Partial-thickness cartilage loss of the medial and lateral
patellar facets with areas of high-grade partial-thickness cartilage
loss and subchondral reactive marrow changes.
4. Mild partial-thickness cartilage loss of the medial femorotibial
compartment.
